# Patient Record
Sex: Female | Born: 1975
Health system: Southern US, Community
[De-identification: ages and names within clinical notes are randomized; demographics above are authoritative.]

## PROBLEM LIST (undated history)

## (undated) DIAGNOSIS — E079 Disorder of thyroid, unspecified: Secondary | ICD-10-CM

## (undated) DIAGNOSIS — T7840XA Allergy, unspecified, initial encounter: Secondary | ICD-10-CM

## (undated) DIAGNOSIS — J45909 Unspecified asthma, uncomplicated: Secondary | ICD-10-CM

## (undated) HISTORY — DX: Allergy, unspecified, initial encounter: T78.40XA

## (undated) HISTORY — DX: Disorder of thyroid, unspecified: E07.9

## (undated) HISTORY — DX: Unspecified asthma, uncomplicated: J45.909

---

## 2006-11-19 ENCOUNTER — Inpatient Hospital Stay (HOSPITAL_COMMUNITY): Admission: AD | Admit: 2006-11-19 | Discharge: 2006-11-23 | Payer: Self-pay | Admitting: Obstetrics and Gynecology

## 2008-01-03 ENCOUNTER — Encounter: Payer: Self-pay | Admitting: Internal Medicine

## 2008-01-03 ENCOUNTER — Ambulatory Visit: Payer: Self-pay | Admitting: Internal Medicine

## 2008-01-03 DIAGNOSIS — J309 Allergic rhinitis, unspecified: Secondary | ICD-10-CM | POA: Insufficient documentation

## 2008-01-03 DIAGNOSIS — F411 Generalized anxiety disorder: Secondary | ICD-10-CM | POA: Insufficient documentation

## 2008-01-03 DIAGNOSIS — D509 Iron deficiency anemia, unspecified: Secondary | ICD-10-CM | POA: Insufficient documentation

## 2008-01-05 ENCOUNTER — Emergency Department (HOSPITAL_COMMUNITY): Admission: EM | Admit: 2008-01-05 | Discharge: 2008-01-05 | Payer: Self-pay | Admitting: Emergency Medicine

## 2008-04-10 ENCOUNTER — Ambulatory Visit: Payer: Self-pay | Admitting: Internal Medicine

## 2008-04-10 DIAGNOSIS — J45909 Unspecified asthma, uncomplicated: Secondary | ICD-10-CM | POA: Insufficient documentation

## 2009-01-26 ENCOUNTER — Inpatient Hospital Stay (HOSPITAL_COMMUNITY): Admission: AD | Admit: 2009-01-26 | Discharge: 2009-01-26 | Payer: Self-pay | Admitting: Obstetrics & Gynecology

## 2009-01-28 ENCOUNTER — Ambulatory Visit (HOSPITAL_COMMUNITY): Admission: RE | Admit: 2009-01-28 | Discharge: 2009-01-28 | Payer: Self-pay | Admitting: Obstetrics and Gynecology

## 2009-02-03 ENCOUNTER — Inpatient Hospital Stay (HOSPITAL_COMMUNITY): Admission: AD | Admit: 2009-02-03 | Discharge: 2009-02-03 | Payer: Self-pay | Admitting: Obstetrics and Gynecology

## 2009-02-08 ENCOUNTER — Ambulatory Visit (HOSPITAL_COMMUNITY): Admission: RE | Admit: 2009-02-08 | Discharge: 2009-02-08 | Payer: Self-pay | Admitting: Obstetrics and Gynecology

## 2009-06-13 ENCOUNTER — Inpatient Hospital Stay (HOSPITAL_COMMUNITY): Admission: RE | Admit: 2009-06-13 | Discharge: 2009-06-15 | Payer: Self-pay | Admitting: Obstetrics and Gynecology

## 2010-02-02 ENCOUNTER — Encounter: Payer: Self-pay | Admitting: Obstetrics and Gynecology

## 2010-02-10 ENCOUNTER — Ambulatory Visit
Admission: RE | Admit: 2010-02-10 | Discharge: 2010-02-10 | Payer: Self-pay | Source: Home / Self Care | Attending: Internal Medicine | Admitting: Internal Medicine

## 2010-02-10 DIAGNOSIS — K649 Unspecified hemorrhoids: Secondary | ICD-10-CM | POA: Insufficient documentation

## 2010-02-19 NOTE — Assessment & Plan Note (Signed)
Summary: last ov 2010/?hemorroid/pain rectal/jones-no openings/cd   Vital Signs:  Patient profile:   35 year old female Height:      64 inches Weight:      123 pounds BMI:     21.19 Temp:     98.0 degrees F oral Pulse rate:   76 / minute Pulse rhythm:   regular Resp:     16 per minute BP sitting:   100 / 62  (left arm) Cuff size:   regular  Vitals Entered By: Lanier Prude, CMA(AAMA) (February 10, 2010 4:20 PM) CC: painful bowel movements and lump on rectum Comments pt is not taking Proctosol, birth control, ProAir, Fluticasone or Prednisone   CC:  painful bowel movements and lump on rectum.  History of Present Illness: C/o constipation x years (after 1st baby). This am she had discofort and felt something near her anus. It was uncomfortable. No bleeding. Lately, her BMs were nl.  Current Medications (verified): 1)  Cetirizine Hcl 10 Mg Tabs (Cetirizine Hcl) .Marland Kitchen.. 1 By Mouth Daily 2)  Proctosol Hc 2.5 % Crea (Hydrocortisone) .... Use Small Amount As Needed 3)  Birth Control Pills (?) 4)  Proair Hfa 108 (90 Base) Mcg/act  Aers (Albuterol Sulfate) .... 2 Inh Q4h As Needed Shortness of Breath 5)  Fluticasone Propionate 50 Mcg/act  Susp (Fluticasone Propionate) .... 2 Sprays Each Nostril Once Daily 6)  Prednisone 10 Mg  Tabs (Prednisone) .... Take 40mg  Qd For 3 Days, Then 20 Mg Qd For 3 Days, Then 10mg  Qd For 6 Days, Then Stop. Take Pc. 7)  Levothroid 75 Mcg Tabs (Levothyroxine Sodium) .Marland Kitchen.. 1 By Mouth Once Daily 8)  Probiotic  Caps (Probiotic Product) .Marland Kitchen.. 1-2 By Mouth Once Daily  Allergies (verified): 1)  ! 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   Skin:  Intact without suspicious lesions or rashes   Impression & Recommendations:  Problem # 1:  HEMORRHOIDS (ICD-455.6) Assessment New  Procedure: Anoscopy Indication: Rectal bleeding Risks and benefits were explained. The pt. was placed in the L decubitus position. Digital rectal exam revealed soft 6 o'clock mass <1 cm. Anoscope was introduced w/o difficulties. Upon withdrawl, a carefull look at the mucosa was obtained. At  6 o'clock a   12     mm hemorrhoid was present with a 5 mm clot without active bleeding about 0.5 cm into tha anal canal. Impression: Intternal hemorrhoid, partially thrombosed. Disposition: see A&P.  Tolerated well. Complications: none.  GI consult if issues  Orders: Anoscopy (84696)  Complete Medication List: 1)  Cetirizine Hcl 10 Mg Tabs (Cetirizine hcl) .Marland Kitchen.. 1 by mouth daily 2)  Proctosol Hc 2.5 % Crea (Hydrocortisone) .... Use small amount as needed 3)  Birth Control Pills (?)  4)  Proair Hfa 108 (90 Base) Mcg/act Aers (Albuterol sulfate) .... 2 inh q4h as needed shortness of breath 5)  Fluticasone Propionate 50 Mcg/act Susp (Fluticasone propionate) .... 2 sprays each nostril once daily 6)  Levothroid 75 Mcg  Tabs (Levothyroxine sodium) .Marland Kitchen.. 1 by mouth once daily 7)  Probiotic Caps (Probiotic product) .Marland Kitchen.. 1-2 by mouth once daily 8)  Anusol-hc 25 Mg Supp (Hydrocortisone acetate) .Marland Kitchen.. 1 pr two times a day for hemorrhoids 9)  Anusol-hc 2.5 % Crea (Hydrocortisone) .... Use two times a day for hemorrhoids 10)  Advair Diskus 100-50 Mcg/dose Misc (Fluticasone-salmeterol) .... Take 1 inh  twice a day  Patient Instructions: 1)  Senokot-S 1 or 2 a  day 2)  Aspirin 325 mg 2 by mouth two times a day pc x 1-2 wks 3)  Call if you are not better in a reasonable amount of time or if worse.  4)  F/u with Dr Jonny Ruiz Prescriptions: ADVAIR DISKUS 100-50 MCG/DOSE MISC (FLUTICASONE-SALMETEROL) Take 1 inh  twice a day  #1 x 3   Entered and Authorized by:   Tresa Garter MD   Signed by:   Tresa Garter MD on 02/10/2010   Method used:   Print then Give to Patient   RxID:   [id number] ANUSOL-HC 2.5 % CREA (HYDROCORTISONE) use two times a day for hemorrhoids  #45 g x 3   Entered and Authorized by:   Tresa Garter MD   Signed by:   Tresa Garter MD on 02/10/2010   Method used:   Print then Give to Patient   RxID:   [id number] ANUSOL-HC 25 MG SUPP (HYDROCORTISONE ACETATE) 1 pr two times a day for hemorrhoids  #20 x 3   Entered and Authorized by:   Tresa Garter MD   Signed by:   Tresa Garter MD on 02/10/2010   Method used:   Print then Give to Patient   RxID:   [id number]    Orders Added: 1)  Est. Patient Level III [01027] 2)  Anoscopy [25366]

## 2010-03-30 LAB — URINALYSIS, ROUTINE W REFLEX MICROSCOPIC
Bilirubin Urine: NEGATIVE
Glucose, UA: NEGATIVE mg/dL
Hgb urine dipstick: NEGATIVE
Ketones, ur: NEGATIVE mg/dL
Ketones, ur: NEGATIVE mg/dL
Specific Gravity, Urine: <1.005 — ABNORMAL LOW (ref 1.005–1.030)
Specific Gravity, Urine: 1.01 (ref 1.005–1.030)
Urobilinogen, UA: 0.2 mg/dL (ref 0.0–1.0)
pH: 7 (ref 5.0–8.0)
pH: 7 (ref 5.0–8.0)

## 2010-03-30 LAB — URINE MICROSCOPIC-ADD ON

## 2010-03-31 LAB — CBC
HCT: 31.3 % — ABNORMAL LOW (ref 36.0–46.0)
Hemoglobin: 10.8 g/dL — ABNORMAL LOW (ref 12.0–15.0)
MCHC: 34.3 g/dL (ref 30.0–36.0)
MCHC: 34.4 g/dL (ref 30.0–36.0)
MCHC: 34.5 g/dL (ref 30.0–36.0)
MCV: 85.5 fL (ref 78.0–100.0)
MCV: 86.2 fL (ref 78.0–100.0)
Platelets: 156 10*3/uL (ref 150–400)
RBC: 3.64 MIL/uL — ABNORMAL LOW (ref 3.87–5.11)
RBC: 4.56 MIL/uL (ref 3.87–5.11)
RDW: 14.4 % (ref 11.5–15.5)
WBC: 11.1 10*3/uL — ABNORMAL HIGH (ref 4.0–10.5)
WBC: 12.8 10*3/uL — ABNORMAL HIGH (ref 4.0–10.5)

## 2010-04-22 ENCOUNTER — Other Ambulatory Visit: Payer: Self-pay | Admitting: Internal Medicine

## 2010-05-27 NOTE — Op Note (Signed)
Maria Crawford, Maria Crawford                 ACCOUNT NO.:  [id number]   MEDICAL RECORD NO.:  [id number]          PATIENT TYPE:  INP   LOCATION:  9107                          FACILITY:  [facility]   PHYSICIAN:  Ilda Mori, M.D.   DATE OF BIRTH:  1975-01-25   DATE OF PROCEDURE:  11/20/2006  DATE OF DISCHARGE:                               OPERATIVE REPORT   PREOPERATIVE DIAGNOSIS:  Failure to progress.   POSTOPERATIVE DIAGNOSIS:  Failure to progress.   PROCEDURE:  Primary low transverse cesarean section.   SURGEON:  Dr. Ilda Mori.   ANESTHESIA:  Epidural.   ESTIMATED BLOOD LOSS:  800 mL.   FINDINGS:  Female infant 8 pounds 5 ounces, Apgar scores 6 and 9, normal-  appearing uterus, tubes and ovaries, slightly meconium-stained amniotic  fluid.   INDICATIONS:  This is a 35 year old primigravid female who was admitted  on the evening of November 7 with rupture of membranes.  The patient was  started on IV Pitocin and given Ancef prophylaxis for positive group B  strep screen with a low risk penicillin allergy.  The patient progressed  slowly through the night on the morning of November 8.  She had a large  gush of fluid which appeared to be rupture of her forewaters or this may  have been the true rupture of membranes.  The patient was approximately  4-5 cm at that time with the vertex in -1, -2 station.  During the  course of the day, the patient's contractions were not consisted. An  IUPC was placed and it took several hours for adequate labor to be  established based upon greater than 200 [place] units.  The patient  was 5-6 cm at 2:30 p.m. and this was felt to be representing a change  with the vertex being well applied to the cervix and beginning to  descend into the birth canal.  After two hours of adequate labor, a  recheck revealed a cervix was only slightly more dilated although the  head appeared to have descended even further.  Based upon a slow  progress and the wishes of  the patient, the decision was made to proceed  with cesarean section.   PROCEDURE:  The patient is taken to the operating room and the epidural  anesthesia that had been used for labor was injected for surgical  anesthesia.  The abdomen was prepped and draped in sterile fashion.  The  bladder had previously been catheterized.  A low transverse abdominal  incision was made and carried down to the fascia which was entered  transversely.  The fascia was then separated from the underlying rectus  muscle and the rectus midline was identified and the peritoneal cavity  was entered by sharp and blunt dissection.  This incision was then  extended bluntly.  A self-retaining retractor was then placed, the lower  segment was identified.  Incision was made and carefully down to the  amnion which was then opened with mild slightly stained meconium fluid  noted.  The infant was then delivered without difficulty.  The placenta  was sent  for cord blood collection at that time.  The uterus was bluntly  curettaged.  The lower segment was then closed with single layer of  running interlocking 0 Vicryl suture.  The tubes and ovaries were  identified and appeared to be normal.  The self-retaining tract was  removed and  peritoneum was closed with running 3-0 Vicryl suture and the rectus  muscle was reapposed in the midline.  The fascia was closed with running  0 Vicryl suture.  The skin was closed with staples.  The patient  tolerated the procedure well and left the operative room in good  condition.      Ilda Mori, M.D.  Electronically Signed     RK/MEDQ  D:  11/20/2006  T:  11/22/2006  Job:  [id number]

## 2010-05-30 NOTE — Discharge Summary (Signed)
NAMETRANISE, FORREST                 ACCOUNT NO.:  [id number]   MEDICAL RECORD NO.:  [id number]           PATIENT TYPE:   LOCATION:                                 FACILITY:   PHYSICIAN:  Carrington Clamp, M.D.      DATE OF BIRTH:   DATE OF ADMISSION:  11/19/2006  DATE OF DISCHARGE:  11/23/2006                               DISCHARGE SUMMARY   FINAL DIAGNOSIS:  Intrauterine pregnancy at 40-2/[redacted] weeks gestation,  active labor, positive group B strep, failure to progress.   PROCEDURE:  Primary low transverse cesarean section.  Surgeon Dr.  Ilda Mori.  Complications none.   This 35 year old G1 P0 was admitted on the evening of November 7 with  positive rupture of membranes in early labor.  The patient did have  positive group B strep status known and was started on Ancef for  prophylaxis and had  history of a low-risk Penicillin allergy.   The patient's antepartum course up to this point had been uncomplicated  except for that positive group B strep culture  found to the end of her  pregnancy.  The patient progressed through the night until the morning  of the 8th, where she was about 4-5 cm at this time. Throughout the  course of the day, patient's contractions were not consistent.  IUPCs  were placed.  By 2:30 that afternoon, the patient still had no change in  her cervix and after 2 hours adequate labor, rechecked later the patient  still had no change.  She was diagnosed with failure to progress and  with a discussion with the patient a decision was made to proceed with a  cesarean section.  The patient was taken to the operating room on  November 20, 2006 by Dr. Ilda Mori where a primary low transverse  cesarean section was performed with the delivery of a 8 pounds 5 ounces  female infant with Apgars of six and nine.  Delivery went without  complications.  The patient's postoperative course was benign without  any significant fevers.  The patient was felt ready for discharge  on  postoperative day #3.  She was sent home on a regular diet, told to  decrease activities, told to continue her vitamins and iron supplement  daily, was given Percocet one to two every 4-6 hours as needed for the  pain, told she could use over-the-counter ibuprofen up to 600 mg every 6  hours as needed for pain, was to follow up in our office in 4 weeks.   Instructions and precautions were reviewed with the patient.   LABS ON DISCHARGE:  She had a hemoglobin of 9.3, white blood cell count  of 10.9, platelets of 177,000.      Leilani Able, P.A.-C.      Carrington Clamp, M.D.  Electronically Signed    MB/MEDQ  D:  12/21/2006  T:  12/21/2006  Job:  [id number]

## 2010-08-21 ENCOUNTER — Ambulatory Visit: Payer: Self-pay | Admitting: Internal Medicine

## 2010-10-17 LAB — URINALYSIS, ROUTINE W REFLEX MICROSCOPIC
Bilirubin Urine: NEGATIVE
Glucose, UA: NEGATIVE mg/dL
Hgb urine dipstick: NEGATIVE
Ketones, ur: 15 mg/dL — AB
Nitrite: NEGATIVE
pH: 6.5 (ref 5.0–8.0)

## 2010-10-17 LAB — POCT I-STAT, CHEM 8
Calcium, Ion: 1.19 mmol/L (ref 1.12–1.32)
Chloride: 106 mEq/L (ref 96–112)
HCT: 43 % (ref 36.0–46.0)
Hemoglobin: 14.6 g/dL (ref 12.0–15.0)
Potassium: 3.2 mEq/L — ABNORMAL LOW (ref 3.5–5.1)
TCO2: 21 mmol/L (ref 0–100)

## 2010-10-17 LAB — CBC
MCV: 78.6 fL (ref 78.0–100.0)
RBC: 5.26 MIL/uL — ABNORMAL HIGH (ref 3.87–5.11)
RDW: 14.5 % (ref 11.5–15.5)

## 2010-10-17 LAB — DIFFERENTIAL
Basophils Absolute: 0.1 10*3/uL (ref 0.0–0.1)
Basophils Relative: 0 % (ref 0–1)
Monocytes Absolute: 0.9 10*3/uL (ref 0.1–1.0)
Monocytes Relative: 7 % (ref 3–12)
Neutrophils Relative %: 59 % (ref 43–77)

## 2010-10-17 LAB — URINE MICROSCOPIC-ADD ON

## 2010-10-21 LAB — CBC
MCHC: 33.9
MCHC: 35.4
MCV: 82.4
MCV: 83.9
MCV: 84.5
Platelets: 148 — ABNORMAL LOW
Platelets: 177
RBC: 3.25 — ABNORMAL LOW
RBC: 4.3
WBC: 10.9 — ABNORMAL HIGH
WBC: 17.1 — ABNORMAL HIGH

## 2010-10-21 LAB — DIFFERENTIAL
Basophils Absolute: 0
Basophils Relative: 0
Eosinophils Absolute: 0.1 — ABNORMAL LOW
Eosinophils Relative: 1
Monocytes Relative: 7
Neutro Abs: 8 — ABNORMAL HIGH
Neutrophils Relative %: 73

## 2010-12-10 ENCOUNTER — Other Ambulatory Visit: Payer: Self-pay | Admitting: Internal Medicine

## 2010-12-10 DIAGNOSIS — E039 Hypothyroidism, unspecified: Secondary | ICD-10-CM

## 2010-12-18 ENCOUNTER — Other Ambulatory Visit: Payer: Self-pay

## 2010-12-19 ENCOUNTER — Other Ambulatory Visit: Payer: Self-pay

## 2011-03-13 ENCOUNTER — Ambulatory Visit
Admission: RE | Admit: 2011-03-13 | Discharge: 2011-03-13 | Disposition: A | Discharge status: Home / Self Care | Payer: BC Managed Care – PPO | Source: Ambulatory Visit | Attending: Internal Medicine | Admitting: Internal Medicine

## 2011-03-13 DIAGNOSIS — E039 Hypothyroidism, unspecified: Secondary | ICD-10-CM

## 2012-06-11 ENCOUNTER — Ambulatory Visit (INDEPENDENT_AMBULATORY_CARE_PROVIDER_SITE_OTHER): Payer: BC Managed Care – PPO | Admitting: Physician Assistant

## 2012-06-11 VITALS — BP 114/68 | HR 92 | Temp 98.3°F | Resp 16 | Ht 64.0 in | Wt 116.0 lb

## 2012-06-11 DIAGNOSIS — J020 Streptococcal pharyngitis: Secondary | ICD-10-CM

## 2012-06-11 DIAGNOSIS — J029 Acute pharyngitis, unspecified: Secondary | ICD-10-CM

## 2012-06-11 LAB — POCT RAPID STREP A (OFFICE): Rapid Strep A Screen: POSITIVE — AB

## 2012-06-11 MED ORDER — AZITHROMYCIN 250 MG PO TABS: ORAL_TABLET | ORAL | Status: DC

## 2012-06-11 NOTE — Progress Notes (Signed)
  Subjective:    Patient ID: Olin Hauser, female    DOB: Jun 26, 1975, 37 y.o.   MRN: [id number]  HPI This 37 y.o. female presents for evaluation of sore throat that began 2 days ago.  Her 35 year old had strep throat last week.  She was delayed in getting an allergy shot (it had been 5 weeks since her previous dose), and she developed increased URI symptoms, sore throat, and generalized muscle aches after the dose.  No fever/chills.  No abdominal pain, N/V.  No rash.    Past medical history, surgical history, family history, social history and problem list reviewed.  Review of Systems As above    Objective:   Physical Exam Blood pressure 114/68, pulse 92, temperature 98.3 F (36.8 C), temperature source Oral, resp. rate 16, height 5\' 4"  (1.626 m), weight 116 lb (52.617 kg), last menstrual period 05/19/2012, SpO2 95.00%. Body mass index is 19.9 kg/(m^2). Well-developed, well nourished WF who is awake, alert and oriented, in NAD. HEENT: [place]/AT, PERRL, EOMI.  Sclera and conjunctiva are clear.  EAC are patent, TMs are normal in appearance. Nasal mucosa is pink and moist. OP is clear. Neck: supple, non-tender, no lymphadenopathy, thyromegaly. Heart: RRR, no murmur Lungs: normal effort, CTA Extremities: no cyanosis, clubbing or edema. Skin: warm and dry without rash. Psychologic: good mood and appropriate affect, normal speech and behavior.   Results for orders placed in visit on 06/11/12  POCT RAPID STREP A (OFFICE)      Result Value Range   Rapid Strep A Screen Positive (*) Negative       Assessment & Plan:  Acute pharyngitis - Plan: POCT rapid strep A  Strep pharyngitis - Plan: azithromycin (ZITHROMAX) 250 MG tablet (PCN allergic).    This diagnosis was surprising, as we both thought it very likely her symptoms were reactive to her allergy injection.  Supportive care.  Anticipatory Guidance.    Fernande Bras, PA-C Physician Assistant-Certified [facility]

## 2012-06-11 NOTE — Patient Instructions (Signed)
Get plenty of rest and drink at least 64 ounces of water daily. 

## 2013-10-31 IMAGING — US US SOFT TISSUE HEAD/NECK
1 series · 14 of 25 positions shown · non-contrast
Comparison: None

CLINICAL DATA: Hypothyroidism, thyroiditis following pregnancy in
2577

THYROID ULTRASOUND
TECHNIQUE: Ultrasound examination of the thyroid gland and adjacent
soft tissues was performed.

[Series 1: us soft tissue head/neck · 0.08mm/px · 14 of 33 slices shown]
[im 1/33]
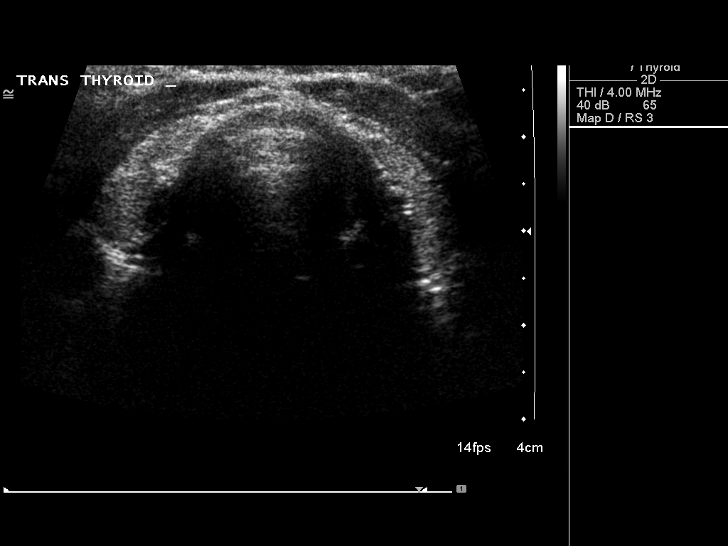
[im 3/33]
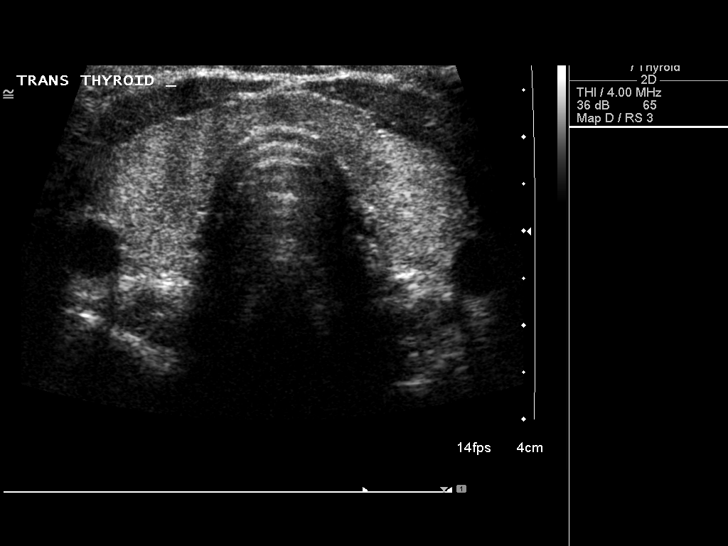
[im 6/33]
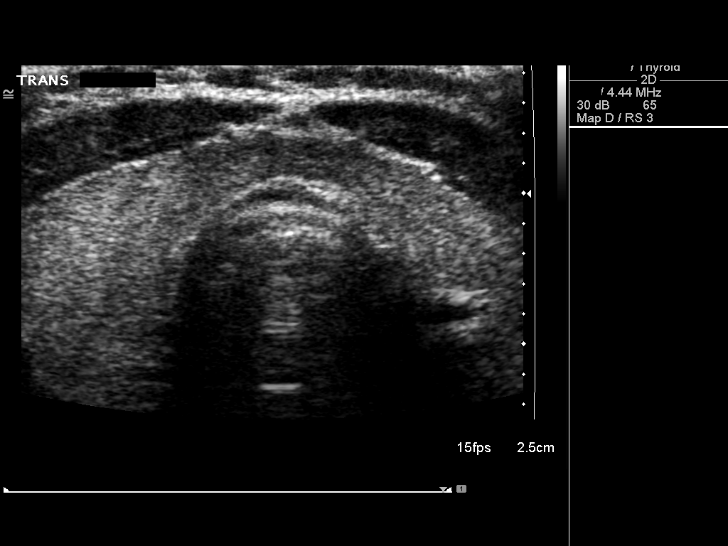
[im 9/33]
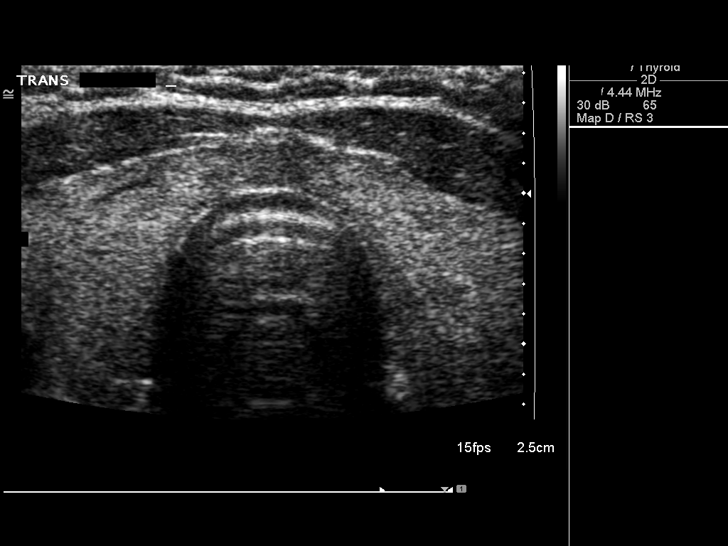
[im 11/33]
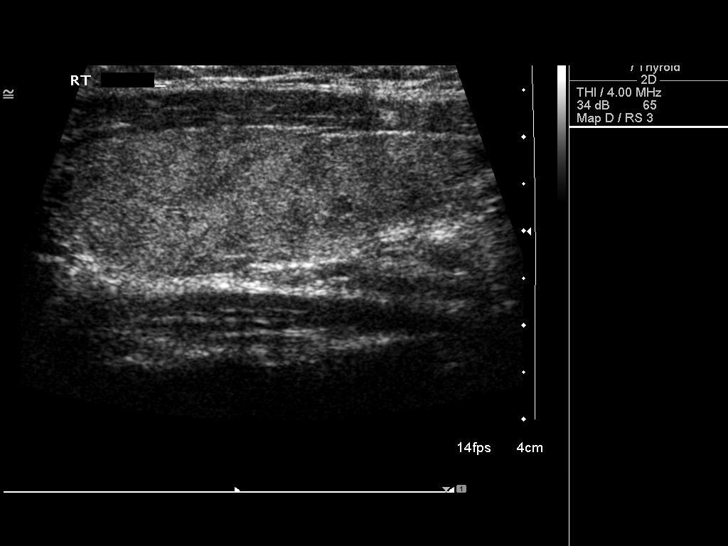
[im 13/33]
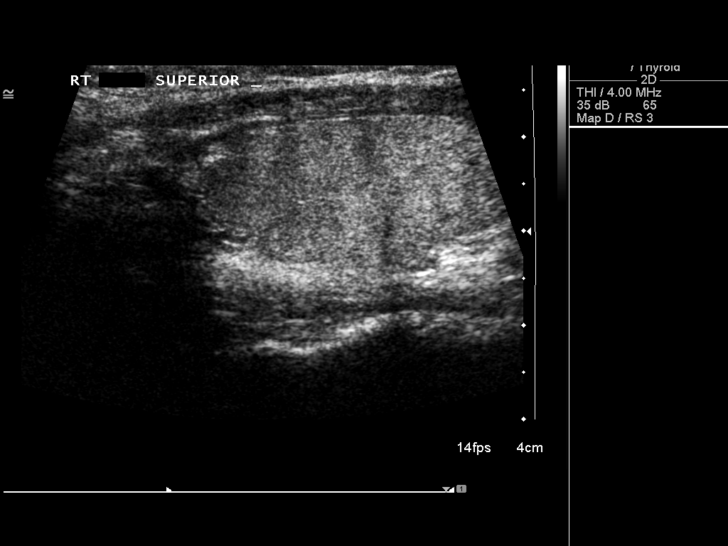
[im 15/33]
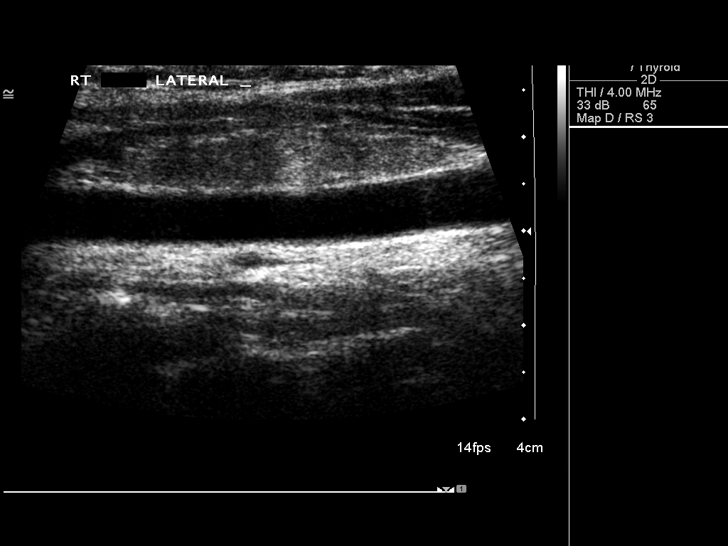
[im 18/33]
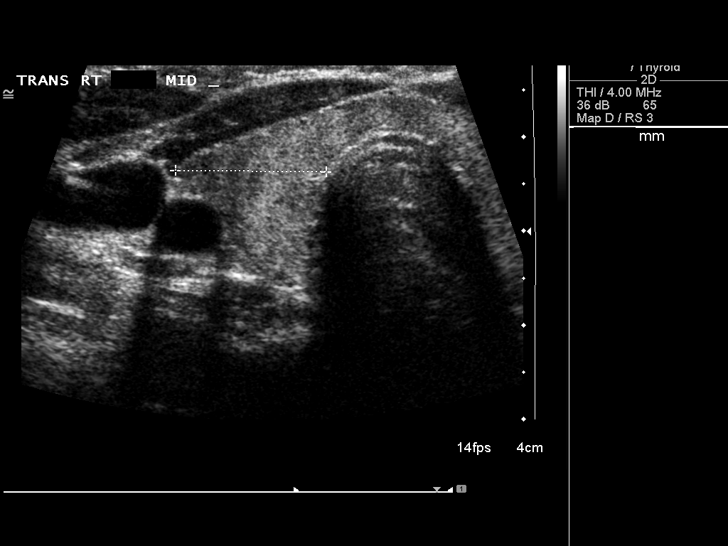
[im 21/33]
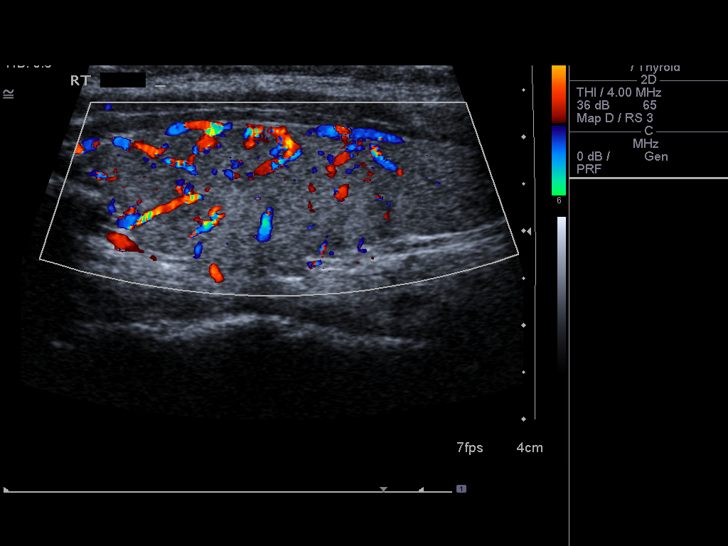
[im 22/33]
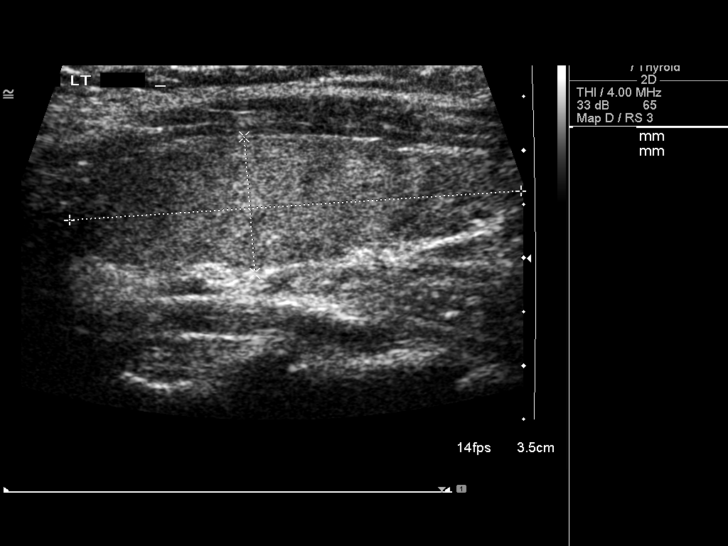
[im 25/33]
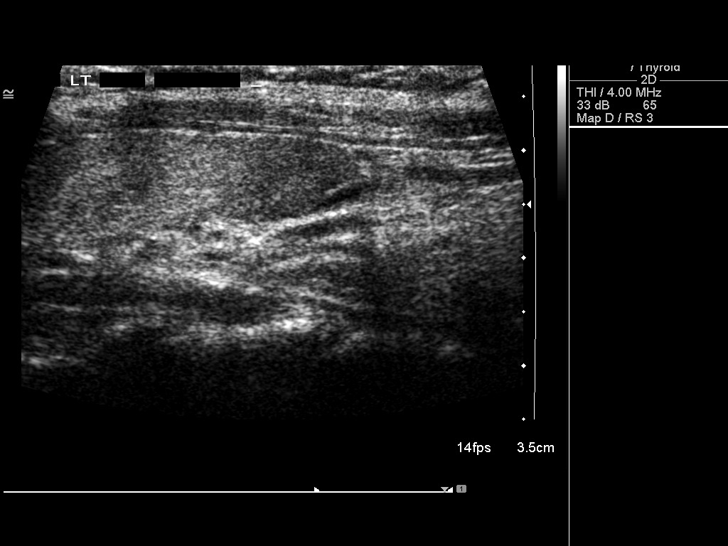
[im 27/33]
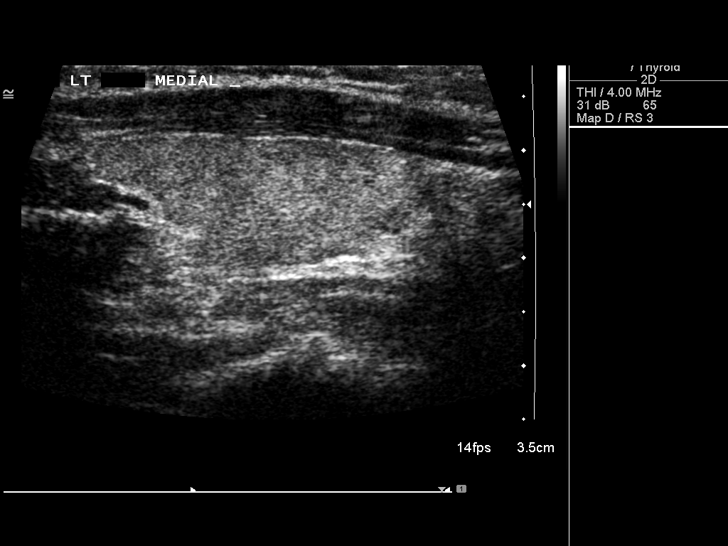
[im 30/33]
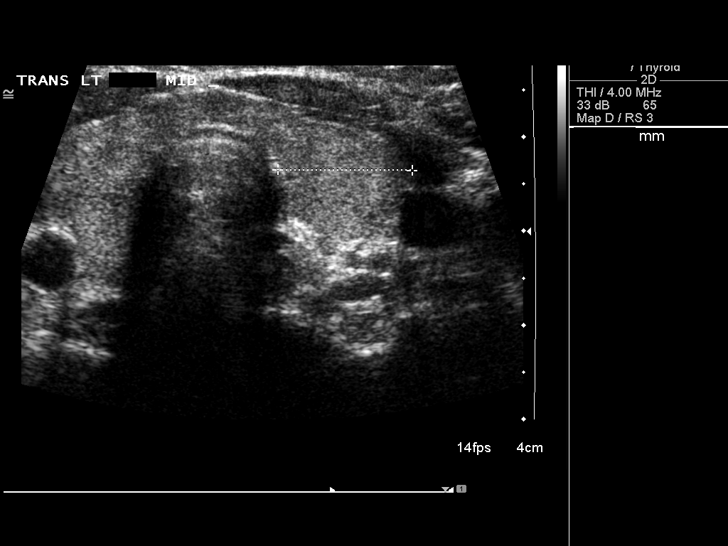
[im 33/33]
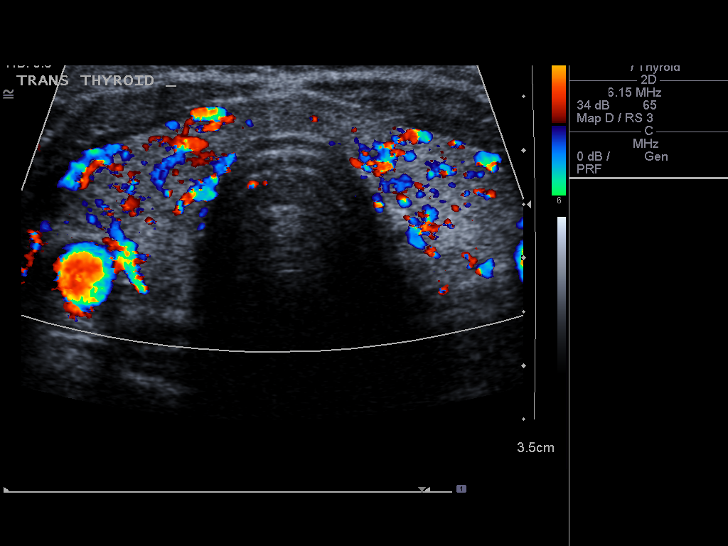

[14 of 25 positions shown; findings below may reference images not displayed]

FINDINGS: Right thyroid lobe:  4.6 x 1.6 x 1.6 cm.
Left thyroid lobe:  4.2 x 1.3 x 1.4 cm.
Isthmus:  4 mm thick

Focal nodules:  Mild diffuse inhomogeneity of thyroid parenchyma
throughout both lobes.  No focal mass or nodule identified.  No
thyroid calcifications or cysts.

Lymphadenopathy:  None identified
IMPRESSION: No focal thyroid abnormalities identified.

## 2014-09-19 ENCOUNTER — Ambulatory Visit (INDEPENDENT_AMBULATORY_CARE_PROVIDER_SITE_OTHER): Payer: BC Managed Care – PPO | Admitting: Urgent Care

## 2014-09-19 VITALS — BP 108/68 | HR 99 | Temp 98.5°F | Resp 17 | Ht 63.5 in | Wt 115.0 lb

## 2014-09-19 DIAGNOSIS — R319 Hematuria, unspecified: Secondary | ICD-10-CM

## 2014-09-19 DIAGNOSIS — R300 Dysuria: Secondary | ICD-10-CM | POA: Diagnosis not present

## 2014-09-19 DIAGNOSIS — N3090 Cystitis, unspecified without hematuria: Secondary | ICD-10-CM | POA: Diagnosis not present

## 2014-09-19 LAB — POCT UA - MICROSCOPIC ONLY
CASTS, UR, LPF, POC: NEGATIVE
CRYSTALS, UR, HPF, POC: NEGATIVE
Mucus, UA: NEGATIVE
Yeast, UA: NEGATIVE

## 2014-09-19 LAB — POCT URINALYSIS DIPSTICK
Bilirubin, UA: NEGATIVE
Glucose, UA: NEGATIVE
KETONES UA: NEGATIVE
Nitrite, UA: NEGATIVE
SPEC GRAV UA: 1.015
UROBILINOGEN UA: 0.2
pH, UA: 8.5

## 2014-09-19 LAB — URINE CULTURE
COLONY COUNT: NO GROWTH
Organism ID, Bacteria: NO GROWTH

## 2014-09-19 MED ORDER — CIPROFLOXACIN HCL 500 MG PO TABS: 500.0000 mg | ORAL_TABLET | Freq: Two times a day (BID) | ORAL | Status: DC

## 2014-09-19 NOTE — Patient Instructions (Signed)

## 2014-09-19 NOTE — Progress Notes (Signed)
    MRN: [id number] DOB: 09-10-1975  Subjective:   Maria Crawford is a 39 y.o. female presenting for chief complaint of Dysuria  Reports 2 day history of dysuria, hematuria and urinary frequency. Has tried apple cider vinegar without any relief. Denies fever, flank pain, abdominal pain, pelvic pain, cloudy malordorous urine, genital rash, genital irritation and vaginal discharge, nausea, vomiting and abdominal pain. Has a history of UTIs, this feels similar to previous UTIs. Denies any other aggravating or relieving factors, no other questions or concerns.  Maria Crawford is taking cetirizine and multivitamins. She is allergic to penicillins.  Maria Crawford  has a past medical history of Allergy; Asthma; and Thyroid disease. Also  has no past surgical history on file.  ROS As in subjective.  Objective:   Vitals: BP 108/68 mmHg  Pulse 99  Temp(Src) 98.5 F (36.9 C) (Oral)  Resp 17  Ht 5' 3.5" (1.613 m)  Wt 115 lb (52.164 kg)  BMI 20.05 kg/m2  SpO2 98%  LMP 09/05/2014  Physical Exam  Constitutional: She is oriented to person, place, and time. She appears well-developed and well-nourished.  Cardiovascular: Normal rate, regular rhythm and intact distal pulses.  Exam reveals no gallop and no friction rub.   No murmur heard. Pulmonary/Chest: No respiratory distress. She has no wheezes. She has no rales.  Abdominal: Soft. Bowel sounds are normal. She exhibits no distension and no mass. There is no tenderness.  No CVA tenderness.  Musculoskeletal: She exhibits no edema.  Neurological: She is alert and oriented to person, place, and time.  Skin: Skin is warm and dry. No rash noted. No erythema. No pallor.    Results for orders placed or performed in visit on 09/19/14 (from the past 24 hour(s))  POCT UA - Microscopic Only     Status: None   Collection Time: 09/19/14  8:54 AM  Result Value Ref Range   WBC, Ur, HPF, POC 10-12    RBC, urine, microscopic 15-20    Bacteria, U Microscopic moderate    Mucus,  UA neg    Epithelial cells, urine per micros 4-6    Crystals, Ur, HPF, POC neg    Casts, Ur, LPF, POC neg    Yeast, UA neg   POCT urinalysis dipstick     Status: Abnormal   Collection Time: 09/19/14  8:54 AM  Result Value Ref Range   Color, UA yellow    Clarity, UA clear    Glucose, UA neg    Bilirubin, UA neg    Ketones, UA neg    Spec Grav, UA 1.015    Blood, UA large    pH, UA 8.5    Protein, UA trace    Urobilinogen, UA 0.2    Nitrite, UA neg    Leukocytes, UA moderate (2+) (A) Negative   Assessment and Plan :   1. Cystitis 2. Dysuria 3. Hematuria - Urine culture pending. Will start cipro x7 days, advised aggressive hydration. RTC if urine cultures have not yet resulted and symptoms worsen including fever, nausea, vomiting, flank pain, abdominal pain.   Wallis Bamberg, PA-C [facility] [phone] 09/19/2014 8:37 AM

## 2014-09-20 ENCOUNTER — Telehealth: Payer: Self-pay | Admitting: Urgent Care

## 2014-09-20 NOTE — Telephone Encounter (Signed)
Urine culture was negative. Recommended patient continue antibiotic course of Cipro, advised that if she has no improvement in the next 3-4 days that she return to clinic for further testing including pelvic exam, STI testing. Request that patient give [place] a call back if she had questions or concerns.

## 2014-09-25 ENCOUNTER — Other Ambulatory Visit: Payer: Self-pay | Admitting: Obstetrics and Gynecology

## 2015-04-17 DIAGNOSIS — F4323 Adjustment disorder with mixed anxiety and depressed mood: Secondary | ICD-10-CM | POA: Diagnosis not present

## 2015-04-18 DIAGNOSIS — R300 Dysuria: Secondary | ICD-10-CM | POA: Diagnosis not present

## 2015-05-06 DIAGNOSIS — F4323 Adjustment disorder with mixed anxiety and depressed mood: Secondary | ICD-10-CM | POA: Diagnosis not present

## 2015-05-08 DIAGNOSIS — Z114 Encounter for screening for human immunodeficiency virus [HIV]: Secondary | ICD-10-CM | POA: Diagnosis not present

## 2015-05-08 DIAGNOSIS — Z6820 Body mass index (BMI) 20.0-20.9, adult: Secondary | ICD-10-CM | POA: Diagnosis not present

## 2015-05-08 DIAGNOSIS — Z01419 Encounter for gynecological examination (general) (routine) without abnormal findings: Secondary | ICD-10-CM | POA: Diagnosis not present

## 2015-05-08 DIAGNOSIS — Z113 Encounter for screening for infections with a predominantly sexual mode of transmission: Secondary | ICD-10-CM | POA: Diagnosis not present

## 2015-05-08 DIAGNOSIS — Z0000 Encounter for general adult medical examination without abnormal findings: Secondary | ICD-10-CM | POA: Diagnosis not present

## 2015-05-08 DIAGNOSIS — Z1159 Encounter for screening for other viral diseases: Secondary | ICD-10-CM | POA: Diagnosis not present

## 2015-06-12 DIAGNOSIS — F4323 Adjustment disorder with mixed anxiety and depressed mood: Secondary | ICD-10-CM | POA: Diagnosis not present

## 2015-07-17 DIAGNOSIS — F4323 Adjustment disorder with mixed anxiety and depressed mood: Secondary | ICD-10-CM | POA: Diagnosis not present

## 2015-08-21 DIAGNOSIS — F4323 Adjustment disorder with mixed anxiety and depressed mood: Secondary | ICD-10-CM | POA: Diagnosis not present

## 2015-09-25 DIAGNOSIS — F4323 Adjustment disorder with mixed anxiety and depressed mood: Secondary | ICD-10-CM | POA: Diagnosis not present

## 2015-10-15 MED FILL — NATURE-THROID 48.75 MG TAB: 48.75 | 30 days supply | Qty: 60 | Fill #0

## 2015-10-23 DIAGNOSIS — F4323 Adjustment disorder with mixed anxiety and depressed mood: Secondary | ICD-10-CM | POA: Diagnosis not present

## 2015-11-04 DIAGNOSIS — F4322 Adjustment disorder with anxiety: Secondary | ICD-10-CM | POA: Diagnosis not present

## 2015-11-05 DIAGNOSIS — F4323 Adjustment disorder with mixed anxiety and depressed mood: Secondary | ICD-10-CM | POA: Diagnosis not present

## 2015-11-14 MED FILL — NATURE-THROID 48.75 MG TAB: 48.75 | 21 days supply | Qty: 42 | Fill #1

## 2016-01-01 DIAGNOSIS — F4323 Adjustment disorder with mixed anxiety and depressed mood: Secondary | ICD-10-CM | POA: Diagnosis not present

## 2016-02-12 MED FILL — PROAIR HFA 90 MCG INHALER: 108 (90 BAS | 25 days supply | Qty: 9 | Fill #0

## 2016-02-13 ENCOUNTER — Encounter: Payer: Self-pay | Admitting: Internal Medicine

## 2016-02-13 ENCOUNTER — Ambulatory Visit (INDEPENDENT_AMBULATORY_CARE_PROVIDER_SITE_OTHER): Payer: BLUE CROSS/BLUE SHIELD | Admitting: Internal Medicine

## 2016-02-13 VITALS — BP 103/60 | HR 100 | Temp 97.7°F | Resp 12

## 2016-02-13 DIAGNOSIS — B349 Viral infection, unspecified: Secondary | ICD-10-CM

## 2016-02-13 DIAGNOSIS — E559 Vitamin D deficiency, unspecified: Secondary | ICD-10-CM | POA: Insufficient documentation

## 2016-02-13 DIAGNOSIS — E039 Hypothyroidism, unspecified: Secondary | ICD-10-CM | POA: Insufficient documentation

## 2016-02-13 DIAGNOSIS — J40 Bronchitis, not specified as acute or chronic: Secondary | ICD-10-CM | POA: Diagnosis not present

## 2016-02-13 LAB — POC INFLUENZA A&B (BINAX/QUICKVUE)
INFLUENZA A, POC: NEGATIVE
Influenza B, POC: NEGATIVE

## 2016-02-13 MED ORDER — PREDNISONE 20 MG PO TABS
ORAL_TABLET | ORAL | 0 refills | Status: DC
Start: 2016-02-13 — End: 2016-07-22

## 2016-02-13 MED ORDER — FLUTICASONE-UMECLIDIN-VILANT 100-62.5-25 MCG/INH IN AEPB: 1.0000 | INHALATION_SPRAY | Freq: Every day | RESPIRATORY_TRACT | 0 refills | Status: DC

## 2016-02-13 MED ORDER — AZITHROMYCIN 250 MG PO TABS: ORAL_TABLET | ORAL | 1 refills | Status: DC

## 2016-02-13 MED FILL — predniSONE 20 MG TABS: 20 | 11 days supply | Qty: 20 | Fill #0

## 2016-02-13 NOTE — Progress Notes (Signed)
[facility]   Lucky CowboyWilliam Mishti Swanton, M.D.    Dyanne CarrelAmanda R. Steffanie Dunnollier, P.A.-C      Terri Piedraourtney Forcucci, P.A.-C  [facility]                [address]                [place], [place] [id number] Telephone [phone] Telefax [phone]  Subjective:    Patient ID: Maria Crawford, female    DOB: 06/01/1975, 41 y.o.   MRN: [id number]  HPI  This very nice 41 yo DWF with Hypothyroidism circa 2011 who is a nurse at [employer] reports her son tested (+) 3 days ago for Flu and she has developed fevers to 100-101 deg F, severe generalized myalgias and cough productive of scant amounts of thick yellow sputum.   Medication Sig  . cetirizine (ZYRTEC) 10 MG  Take 10 mg by mouth daily.  . magnesium citrate  Take 1 Bottle by mouth once.  . Multiple Vitamins-Minerals  Take 1 tablet by mouth daily.   [place] Thyroid  Uncertain of dose  .  Allergy shots once a week.   Allergies  Allergen Reactions  . Penicillins     REACTION: hives   Past Medical History:  Diagnosis Date  . Allergy   . Asthma   . Thyroid disease    No past surgical history on file.  Review of Systems  10 point systems review negative except as above.    Objective:   Physical Exam  BP 103/60   Pulse 100   Temp 97.7 F (36.5 C)   Resp 12   Dry brassy cough. Sl. Hoarse. No Stridor.   HEENT - Eac's patent. TM's Nl. EOM's full. PERRLA. NasoOroPharynx 1(+) with sl exudate. . Neck - supple. Nl Thyroid. Carotids 2+ & No bruits, nodes, JVD Chest - Few scattered inspiratory rales and coarse expiratory  rhonchi, but no wheezes. Cor - Nl HS. RRR w/o sig MGR. PP 1(+). No edema. MS- FROM w/o deformities. Muscle power, tone and bulk Nl. Gait Nl. Neuro -  Nl w/o focal abnormalities. Skin - exposed clear w/o rash, cyanosis, icterus    Assessment & Plan:   1. Tracheobronchitis  - PROAIR HFA 108 (90 Base) MCG/ACT inhaler;   - Sx's Trelegy Ellipta   2. Viral illness  -  predniSONE (DELTASONE) 20 MG tablet; 1 tab 3 x day for 3 days, then 1 tab 2 x day for 3 days, then 1 tab 1 x day for 5 days  Dispense: 20 tablet; Refill: 0  - azithromycin (ZITHROMAX) 250 MG tablet; Take 2 tablets (500 mg) on  Day 1,  followed by 1 tablet (250 mg) once daily on Days 2 through 5.  Dispense: 6 each; Refill: 1  - POC Influenza A&B(BINAX/QUICKVUE) - negative

## 2016-02-17 MED FILL — ARMOUR THYROID 90 MG TABLET: 90 | 30 days supply | Qty: 30 | Fill #0

## 2016-03-02 DIAGNOSIS — E559 Vitamin D deficiency, unspecified: Secondary | ICD-10-CM | POA: Diagnosis not present

## 2016-03-02 DIAGNOSIS — E039 Hypothyroidism, unspecified: Secondary | ICD-10-CM | POA: Diagnosis not present

## 2016-03-02 DIAGNOSIS — E063 Autoimmune thyroiditis: Secondary | ICD-10-CM | POA: Diagnosis not present

## 2016-03-02 DIAGNOSIS — R5383 Other fatigue: Secondary | ICD-10-CM | POA: Diagnosis not present

## 2016-03-04 ENCOUNTER — Ambulatory Visit (INDEPENDENT_AMBULATORY_CARE_PROVIDER_SITE_OTHER): Payer: BLUE CROSS/BLUE SHIELD | Admitting: Psychology

## 2016-03-04 DIAGNOSIS — F411 Generalized anxiety disorder: Secondary | ICD-10-CM | POA: Diagnosis not present

## 2016-03-12 DIAGNOSIS — R5383 Other fatigue: Secondary | ICD-10-CM | POA: Diagnosis not present

## 2016-03-12 DIAGNOSIS — R5381 Other malaise: Secondary | ICD-10-CM | POA: Diagnosis not present

## 2016-03-12 DIAGNOSIS — F439 Reaction to severe stress, unspecified: Secondary | ICD-10-CM | POA: Diagnosis not present

## 2016-03-12 DIAGNOSIS — E063 Autoimmune thyroiditis: Secondary | ICD-10-CM | POA: Diagnosis not present

## 2016-04-08 ENCOUNTER — Ambulatory Visit: Payer: BLUE CROSS/BLUE SHIELD | Admitting: Psychology

## 2016-04-29 ENCOUNTER — Ambulatory Visit (INDEPENDENT_AMBULATORY_CARE_PROVIDER_SITE_OTHER): Payer: BLUE CROSS/BLUE SHIELD | Admitting: Psychology

## 2016-04-29 DIAGNOSIS — F411 Generalized anxiety disorder: Secondary | ICD-10-CM | POA: Diagnosis not present

## 2016-06-17 ENCOUNTER — Ambulatory Visit (INDEPENDENT_AMBULATORY_CARE_PROVIDER_SITE_OTHER): Payer: BLUE CROSS/BLUE SHIELD | Admitting: Psychology

## 2016-06-17 DIAGNOSIS — F411 Generalized anxiety disorder: Secondary | ICD-10-CM

## 2016-07-22 ENCOUNTER — Other Ambulatory Visit: Payer: Self-pay | Admitting: Internal Medicine

## 2016-07-22 DIAGNOSIS — B349 Viral infection, unspecified: Secondary | ICD-10-CM

## 2016-07-22 DIAGNOSIS — J40 Bronchitis, not specified as acute or chronic: Secondary | ICD-10-CM

## 2016-07-22 MED ORDER — PROAIR HFA 108 (90 BASE) MCG/ACT IN AERS: INHALATION_SPRAY | RESPIRATORY_TRACT | 3 refills | Status: DC

## 2016-07-22 MED ORDER — AZITHROMYCIN 250 MG PO TABS
ORAL_TABLET | ORAL | 1 refills | Status: DC
Start: 2016-07-22 — End: 2016-07-29

## 2016-07-22 MED ORDER — PREDNISONE 20 MG PO TABS: ORAL_TABLET | ORAL | 0 refills | Status: DC

## 2016-07-22 MED FILL — PROAIR HFA 90 MCG INHALER: 108 (90 BAS | 16 days supply | Qty: 9 | Fill #0

## 2016-07-29 ENCOUNTER — Ambulatory Visit (INDEPENDENT_AMBULATORY_CARE_PROVIDER_SITE_OTHER): Payer: BLUE CROSS/BLUE SHIELD | Admitting: Internal Medicine

## 2016-07-29 ENCOUNTER — Encounter: Payer: Self-pay | Admitting: Internal Medicine

## 2016-07-29 VITALS — BP 96/64 | HR 64 | Temp 97.3°F | Resp 16 | Ht 63.5 in | Wt 118.0 lb

## 2016-07-29 DIAGNOSIS — F988 Other specified behavioral and emotional disorders with onset usually occurring in childhood and adolescence: Secondary | ICD-10-CM

## 2016-07-29 MED ORDER — BUPROPION HCL ER (XL) 150 MG PO TB24: 150.0000 mg | ORAL_TABLET | ORAL | 3 refills | Status: DC

## 2016-07-29 NOTE — Patient Instructions (Addendum)
Attention Deficit Hyperactivity Disorder  Attention deficit hyperactivity disorder (ADHD) is a condition that can make it hard for a person to pay attention and concentrate or to control his or her behavior. The person may also have a lot of energy. ADHD is a disorder of the brain (neurodevelopmental disorder), and symptoms are typically first seen in early childhood. It is a common reason for behavioral and academic problems in school. There are three main types of ADHD:  Inattentive. With this type, persons have difficulty paying attention.  Hyperactive-impulsive. With this type, people have a lot of energy and have difficulty controlling their behavior.  Combination. This type involves having symptoms of both of the other types.  ADHD is a lifelong condition. If it is not treated, the disorder can affect one's future academic achievement, employment, and relationships.  What are the causes?  The exact cause of this condition is not known.  What are the signs or symptoms?  Symptoms of this condition depend on the type of ADHD. Symptoms are listed here for each type:  Inattentive   Problems with organization.  Difficulty staying focused.  Problems completing assignments at school.  Often making simple mistakes.  Problems sustaining mental effort.  Not listening to instructions.  Losing things often.  Forgetting things often.  Being easily distracted.   Hyperactive-impulsive   Fidgeting often.  Difficulty sitting still in one's seat.  Talking a lot.  Talking out of turn.  Interrupting others.  Difficulty relaxing or doing quiet activities.  High energy levels and constant movement.  Difficulty waiting.  Always "on the go."   Combination   Having symptoms of both of the other types. People with ADHD may feel frustrated with themselves and may find school to be particularly discouraging. They often perform below their abilities in school. As persons  get older, the excess movement can lessen, but the problems with paying attention and staying organized often continue. Most people do not outgrow ADHD, but with good treatment, they can learn to cope with the symptoms.  How is this diagnosed?  This condition is diagnosed based on a one's symptoms and academic history. The person's health care provider will do a complete assessment. As part of the assessment, the health care provider will ask the person questions. The health care provider looks for specific symptoms of ADHD.   Diagnosis will include:  Ruling out other reasons for the behavior.  A diagnosis is made only after all information from multiple people has been considered. How is this treated?  Treatment for this condition may include:   Behavior therapy.    Medicines to decrease impulsivity and hyperactivity and to increase attention. .   Make sure the person gets a full night of sleep and regular daily exercise.    Help manage behavior by following the techniques learned in therapy. These may include: ? Looking for good behavior and rewarding it. ? Making rules for behavior ? Giving clear instructions. ? Responding consistently tochallenging behaviors. ? Setting realistic goals. ? Looking for activities that can lead to success and self-esteem. ? Making time for pleasant activities  ? Giving lots of affection. ?    learn to be organized. Some ways to do this include: ? Keeping daily schedules the same. Have a regular wake-up time and bedtime. Schedule all activities, including time for homework and time for play. Post the schedule.  Mark schedule changes in advance. ? Having a regular place  to store items such as clothing, backpacks, and  school supplies. ? Encouraging  to write down  Assignments. ?  General instructions  Learn as much as you can about ADHD. This will improve your ability to help.

## 2016-07-29 NOTE — Progress Notes (Signed)
 ADULT & ADOLESCENT INTERNAL MEDICINE   Lucky CowboyWilliam Jesyka Slaght, M .D.      Dyanne CarrelAmanda R. Steffanie Dunnollier, P.A.-C [facility] [address]   [place] [phone] Telephone [phone]) [phone] Telefax [phone](336) [phone]  Subjective:    Patient ID: Maria Crawford, female    DOB: 12/14/1975, 41 y.o.   MRN: [id number]  HPI  This very nice 41 yo DWF with hx/o compensated Hypothyroidism and "allergies" presents with a hx/o distractibility, short attention span, difficulty focusing , daydreaming and difficulty staying on task. She scored 75-80% in the often to very often scale of the Adult ADHD self Report Scale (ASRS-v1) symptom checklist  Medication Sig  . cetirizine  10 MG tablet Take 10 mg by mouth daily.  . magnesium citrate SOLN Take 1 Bottle by mouth once.  . Multi-Vit w/Min Take 1 tablet by mouth daily.  Marland Kitchen. PROAIR HFA   inhaler 1 to 2 inhalations 10-15 minutes apart every 4 hrs as needed   . ARMOUR THYROID 90 MG     Allergies  Allergen Reactions  . Penicillins     REACTION: hives   Review of Systems  10 point systems review negative except as above.    Objective:   Physical Exam  BP 96/64   Pulse 64   Temp (!) 97.3 F (36.3 C)   Resp 16   Ht 5' 3.5" (1.613 m)   Wt 118 lb (53.5 kg)   BMI 20.57 kg/m   HEENT - WNL. Neck - supple.  Chest - Clear equal BS. Cor - Nl HS. RRR w/o sig MGR. PP 1(+). No edema. MS- FROM w/o deformities.  Gait Nl. Neuro -  Nl w/o focal abnormalities.    Assessment & Plan:   1. Attention deficit disorder (ADD) without hyperactivity  - buPROPion (WELLBUTRIN XL) 150 MG 24 hr tablet; Take 1 tablet (150 mg total) by mouth every morning.  Dispense: 90 tablet; Refill: 3  - Discussed treatment options and to avoid stimulants for now  - ROV 4-6 weeks

## 2016-08-19 DIAGNOSIS — E039 Hypothyroidism, unspecified: Secondary | ICD-10-CM | POA: Diagnosis not present

## 2016-08-19 DIAGNOSIS — E063 Autoimmune thyroiditis: Secondary | ICD-10-CM | POA: Diagnosis not present

## 2016-08-19 DIAGNOSIS — E559 Vitamin D deficiency, unspecified: Secondary | ICD-10-CM | POA: Diagnosis not present

## 2016-08-19 DIAGNOSIS — R5383 Other fatigue: Secondary | ICD-10-CM | POA: Diagnosis not present

## 2016-09-02 ENCOUNTER — Ambulatory Visit: Payer: Self-pay | Admitting: Internal Medicine

## 2016-09-09 ENCOUNTER — Ambulatory Visit (INDEPENDENT_AMBULATORY_CARE_PROVIDER_SITE_OTHER): Payer: BLUE CROSS/BLUE SHIELD | Admitting: Psychology

## 2016-09-09 DIAGNOSIS — F411 Generalized anxiety disorder: Secondary | ICD-10-CM

## 2016-09-10 ENCOUNTER — Ambulatory Visit (INDEPENDENT_AMBULATORY_CARE_PROVIDER_SITE_OTHER): Payer: BLUE CROSS/BLUE SHIELD | Admitting: Internal Medicine

## 2016-09-10 DIAGNOSIS — F988 Other specified behavioral and emotional disorders with onset usually occurring in childhood and adolescence: Secondary | ICD-10-CM | POA: Diagnosis not present

## 2016-09-10 MED ORDER — BUPROPION HCL ER (XL) 300 MG PO TB24: ORAL_TABLET | ORAL | 1 refills | Status: DC

## 2016-09-10 MED FILL — buPROPion HCL ER (XL) 300 M: 300 | 90 days supply | Qty: 90 | Fill #0

## 2016-09-12 ENCOUNTER — Encounter: Payer: Self-pay | Admitting: Internal Medicine

## 2016-09-12 NOTE — Progress Notes (Signed)
[place] ADULT & ADOLESCENT INTERNAL MEDICINE  Lucky CowboyWilliam Lya Holben, M.D.      Dyanne CarrelAmanda R. Steffanie Dunnollier, P.A.-C [facility] [address]  [place], [place] [phone] Telephone [phone](336) [phone] Telefax [phone]  Subjective:    Patient ID: Maria Crawford, female    DOB: 05/09/1975, 41 y.o.   MRN: [id number]  HPI  Maria SettleBrooke is a very nice 41 yo DWF who returns for 6 week f/u on Bupropion 15-0 for inattentive ADD. Patient reports significant improvement in focus/concentration as confirmed by comparison of Adult ADD Self Report scale (ASRA-v1.1). She reports improved work productivityand desires to continue treat ment. She disavows any perceived SE's.   Medication Sig  . cetirizine (ZYRTEC) 10 MG tablet Take 10 mg by mouth daily.  . magnesium cit SOLN Take 1 Bottle by mouth once.  . Multiple Vitamins-Minerals  Take 1 tablet by mouth daily.  Marland Kitchen. PROAIR HFA inhaler 1 to 2 inhalations 10-15 min apart every 4 hours as needed   . Thyroid 48.75 MG TABS Take 1 tab 2  times daily.  Marland Kitchen. buPROPion- XL 150 MG  Take 1 tab every morning.   Allergies  Allergen Reactions  . Penicillins     REACTION: hives   Past Medical History:  Diagnosis Date  . Allergy   . Asthma   . Thyroid disease    Review of Systems  10 point systems review negative except as above.    Objective:   Physical Exam  BP 100/68   Pulse 76   Temp (!) 97.5 F (36.4 C)   Resp 16   Ht 5' 3.5" (1.613 m)   Wt 118 lb 6.4 oz (53.7 kg)   BMI 20.64 kg/m   HEENT - WNL. Neck - supple.  Chest - Clear. Cor - Nl HS. RRR w/o sig M MS- FROM. Gait Nl. Neuro -  Nl w/o focal abnormalities.    Assessment & Plan:   1. Attention deficit disorder (ADD) without hyperactivity  - buPROPion (WELLBUTRIN XL) 300 MG 24 hr tablet; Take 1 tablet every morning for Focus / Concentration  Dispense: 90 tablet; Refill: 1  - Discussed meds & SE's. Discussed benefits of prudent diet and exercise.  - ROV 1 month

## 2016-09-15 MED FILL — NATURE-THROID 48.75 MG TAB: 48.75 | 30 days supply | Qty: 60 | Fill #0

## 2016-10-14 MED FILL — NATURE-THROID 48.75 MG TAB: 48.75 | 30 days supply | Qty: 60 | Fill #1

## 2016-10-15 ENCOUNTER — Ambulatory Visit (INDEPENDENT_AMBULATORY_CARE_PROVIDER_SITE_OTHER): Payer: BLUE CROSS/BLUE SHIELD | Admitting: Internal Medicine

## 2016-10-15 ENCOUNTER — Ambulatory Visit: Payer: Self-pay | Admitting: Internal Medicine

## 2016-10-15 VITALS — BP 96/62 | HR 72 | Temp 97.7°F | Resp 16 | Ht 63.5 in | Wt 117.4 lb

## 2016-10-15 DIAGNOSIS — F988 Other specified behavioral and emotional disorders with onset usually occurring in childhood and adolescence: Secondary | ICD-10-CM

## 2016-10-17 ENCOUNTER — Encounter: Payer: Self-pay | Admitting: Internal Medicine

## 2016-10-17 NOTE — Progress Notes (Signed)
[place] ADULT & ADOLESCENT INTERNAL MEDICINE   Lucky Cowboy, M.D.    Dyanne Carrel. Steffanie Dunn, P.A.-C      Judd Gaudier, DNP [facility]                [address]                [place], [place]. [address] Telephone [phone] Telefax [phone]  Subjective:    Patient ID: Maria Crawford, female    DOB: 1976-01-11, 41 y.o.   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 She feels attitude is much calmer as she is less frustrated and denies any perceived SE's.     Medication Sig  . buPROPion-XL 300 MG Take 1 tablet every morning for Focus / Concentration  . Cetirizine 10 MG tablet Take 10 mg by mouth daily.  . magnesium citrate Take 1 Bottle by mouth once.  . Multiple Vitamins-Minerals  Take 1 tablet by mouth daily.  Marland Kitchen PROAIR HFA   inhaler 1 to 2 inhalations 10-15 minutes apart every 4 hours as needed   . Thyroid 48.75 MG TABS Take 1 tablet by mouth 2 (two) times daily.   Allergies  Allergen Reactions  . Penicillins     REACTION: hives   Past Medical History:  Diagnosis Date  . Allergy   . Asthma   . Thyroid disease    Review of Systems     10 point systems review negative except as above.    Objective:   Physical Exam  BP 96/62   Pulse 72   Temp 97.7 F (36.5 C)   Resp 16   Ht 5' 3.5" (1.613 m)   Wt 117 lb 6.4 oz (53.3 kg)   BMI 20.47 kg/m   HEENT - WNL. Neck - supple.  Chest - Clear equal BS. Cor - Nl HS. RRR w/o sig MGR. PP 1(+). No edema. MS- FROM w/o deformities.  Gait Nl. Neuro -  Nl w/o focal abnormalities.    Assessment & Plan:   1. Attention deficit disorder (ADD) without hyperactivity  - Discussed meds, SE's, continue exercise (teaches Pilades) and prudent diet.

## 2016-10-28 ENCOUNTER — Ambulatory Visit (INDEPENDENT_AMBULATORY_CARE_PROVIDER_SITE_OTHER): Payer: BLUE CROSS/BLUE SHIELD | Admitting: Psychology

## 2016-10-28 DIAGNOSIS — F411 Generalized anxiety disorder: Secondary | ICD-10-CM | POA: Diagnosis not present

## 2016-11-12 MED FILL — NATURE-THROID 48.75 MG TAB: 48.75 | 30 days supply | Qty: 60 | Fill #0

## 2016-12-09 ENCOUNTER — Ambulatory Visit (INDEPENDENT_AMBULATORY_CARE_PROVIDER_SITE_OTHER): Payer: BLUE CROSS/BLUE SHIELD | Admitting: Psychology

## 2016-12-09 DIAGNOSIS — F411 Generalized anxiety disorder: Secondary | ICD-10-CM | POA: Diagnosis not present

## 2017-01-07 MED FILL — BUPROPION HCL XL 300 MG TAB: 300 | 90 days supply | Qty: 90 | Fill #1

## 2017-01-18 ENCOUNTER — Ambulatory Visit: Payer: BLUE CROSS/BLUE SHIELD | Admitting: Psychology

## 2017-01-18 ENCOUNTER — Other Ambulatory Visit: Payer: Self-pay | Admitting: Internal Medicine

## 2017-01-18 DIAGNOSIS — B349 Viral infection, unspecified: Secondary | ICD-10-CM

## 2017-01-18 MED ORDER — AZITHROMYCIN 250 MG PO TABS: ORAL_TABLET | ORAL | 1 refills | Status: DC

## 2017-01-18 MED ORDER — PREDNISONE 20 MG PO TABS: ORAL_TABLET | ORAL | 0 refills | Status: DC

## 2017-01-18 MED ORDER — PROMETHAZINE-DM 6.25-15 MG/5ML PO SYRP: ORAL_SOLUTION | ORAL | 1 refills | Status: DC

## 2017-01-18 MED FILL — AZITHROMYCIN 250 MG TAB: 250 | 5 days supply | Qty: 6 | Fill #0

## 2017-02-18 ENCOUNTER — Ambulatory Visit (INDEPENDENT_AMBULATORY_CARE_PROVIDER_SITE_OTHER): Payer: BLUE CROSS/BLUE SHIELD | Admitting: Psychology

## 2017-02-18 DIAGNOSIS — F411 Generalized anxiety disorder: Secondary | ICD-10-CM

## 2017-02-25 DIAGNOSIS — E559 Vitamin D deficiency, unspecified: Secondary | ICD-10-CM | POA: Diagnosis not present

## 2017-02-25 DIAGNOSIS — R5383 Other fatigue: Secondary | ICD-10-CM | POA: Diagnosis not present

## 2017-02-25 DIAGNOSIS — E063 Autoimmune thyroiditis: Secondary | ICD-10-CM | POA: Diagnosis not present

## 2017-02-25 DIAGNOSIS — E039 Hypothyroidism, unspecified: Secondary | ICD-10-CM | POA: Diagnosis not present

## 2017-03-02 ENCOUNTER — Ambulatory Visit: Payer: BLUE CROSS/BLUE SHIELD | Admitting: Psychology

## 2017-03-16 ENCOUNTER — Ambulatory Visit (INDEPENDENT_AMBULATORY_CARE_PROVIDER_SITE_OTHER): Payer: BLUE CROSS/BLUE SHIELD | Admitting: Psychology

## 2017-03-16 DIAGNOSIS — F411 Generalized anxiety disorder: Secondary | ICD-10-CM

## 2017-03-23 DIAGNOSIS — E039 Hypothyroidism, unspecified: Secondary | ICD-10-CM | POA: Diagnosis not present

## 2017-03-23 DIAGNOSIS — R5381 Other malaise: Secondary | ICD-10-CM | POA: Diagnosis not present

## 2017-03-23 DIAGNOSIS — R5383 Other fatigue: Secondary | ICD-10-CM | POA: Diagnosis not present

## 2017-03-23 DIAGNOSIS — E063 Autoimmune thyroiditis: Secondary | ICD-10-CM | POA: Diagnosis not present

## 2017-03-30 ENCOUNTER — Ambulatory Visit (INDEPENDENT_AMBULATORY_CARE_PROVIDER_SITE_OTHER): Payer: BLUE CROSS/BLUE SHIELD | Admitting: Psychology

## 2017-03-30 DIAGNOSIS — F411 Generalized anxiety disorder: Secondary | ICD-10-CM

## 2017-04-22 ENCOUNTER — Ambulatory Visit: Payer: Self-pay | Admitting: Internal Medicine

## 2017-04-22 NOTE — Progress Notes (Signed)
                 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       Today's  . Patient has had no complaints of any cardiac type chest pain, palpitations, dyspnea / orthopnea / PND, dizziness, claudication, or dependent edema.

## 2017-05-10 ENCOUNTER — Other Ambulatory Visit: Payer: Self-pay | Admitting: Internal Medicine

## 2017-05-10 ENCOUNTER — Ambulatory Visit (INDEPENDENT_AMBULATORY_CARE_PROVIDER_SITE_OTHER): Payer: BLUE CROSS/BLUE SHIELD | Admitting: Internal Medicine

## 2017-05-10 ENCOUNTER — Encounter: Payer: Self-pay | Admitting: Internal Medicine

## 2017-05-10 VITALS — BP 118/82 | HR 72 | Temp 97.5°F | Resp 16 | Ht 63.5 in | Wt 116.0 lb

## 2017-05-10 DIAGNOSIS — E039 Hypothyroidism, unspecified: Secondary | ICD-10-CM

## 2017-05-10 DIAGNOSIS — R739 Hyperglycemia, unspecified: Secondary | ICD-10-CM

## 2017-05-10 DIAGNOSIS — E559 Vitamin D deficiency, unspecified: Secondary | ICD-10-CM

## 2017-05-10 DIAGNOSIS — Z79899 Other long term (current) drug therapy: Secondary | ICD-10-CM | POA: Diagnosis not present

## 2017-05-10 DIAGNOSIS — J029 Acute pharyngitis, unspecified: Secondary | ICD-10-CM

## 2017-05-10 DIAGNOSIS — R5383 Other fatigue: Secondary | ICD-10-CM

## 2017-05-10 LAB — COMPLETE METABOLIC PANEL WITH GFR
AG Ratio: 1.8 (calc) (ref 1.0–2.5)
ALBUMIN MSPROF: 4.8 g/dL (ref 3.6–5.1)
ALT: 15 U/L (ref 6–29)
AST: 15 U/L (ref 10–30)
Alkaline phosphatase (APISO): 36 U/L (ref 33–115)
BUN: 15 mg/dL (ref 7–25)
CALCIUM: 9.9 mg/dL (ref 8.6–10.2)
CO2: 29 mmol/L (ref 20–32)
CREATININE: 0.94 mg/dL (ref 0.50–1.10)
Chloride: 106 mmol/L (ref 98–110)
GFR, EST AFRICAN AMERICAN: 88 mL/min/{1.73_m2} (ref >=60)
GFR, EST NON AFRICAN AMERICAN: 76 mL/min/{1.73_m2} (ref >=60)
Globulin: 2.6 g/dL (calc) (ref 1.9–3.7)
Glucose, Bld: 162 mg/dL — ABNORMAL HIGH (ref 65–99)
Potassium: 4.6 mmol/L (ref 3.5–5.3)
Sodium: 142 mmol/L (ref 135–146)
TOTAL PROTEIN: 7.4 g/dL (ref 6.1–8.1)
Total Bilirubin: 0.5 mg/dL (ref 0.2–1.2)

## 2017-05-10 LAB — CBC WITH DIFFERENTIAL/PLATELET
Basophils Absolute: 44 cells/uL (ref 0–200)
Basophils Relative: 0.5 %
EOS ABS: 87 {cells}/uL (ref 15–500)
EOS PCT: 1 %
HEMATOCRIT: 39.8 % (ref 35.0–45.0)
HEMOGLOBIN: 12.9 g/dL (ref 11.7–15.5)
LYMPHS ABS: 1575 {cells}/uL (ref 850–3900)
MCH: 25.6 pg — AB (ref 27.0–33.0)
MCHC: 32.4 g/dL (ref 32.0–36.0)
MCV: 79 fL — ABNORMAL LOW (ref 80.0–100.0)
MONOS PCT: 7 %
MPV: 11.8 fL (ref 7.5–12.5)
NEUTROS PCT: 73.4 %
Neutro Abs: 6386 cells/uL (ref 1500–7800)
Platelets: 296 10*3/uL (ref 140–400)
RBC: 5.04 10*6/uL (ref 3.80–5.10)
RDW: 12.7 % (ref 11.0–15.0)
Total Lymphocyte: 18.1 %
WBC mixed population: 609 cells/uL (ref 200–950)
WBC: 8.7 10*3/uL (ref 3.8–10.8)

## 2017-05-10 LAB — POCT RAPID STREP A (OFFICE): RAPID STREP A SCREEN: NEGATIVE

## 2017-05-10 LAB — TSH: TSH: 0.84 mIU/L

## 2017-05-10 MED ORDER — PREDNISONE 20 MG PO TABS: ORAL_TABLET | ORAL | 0 refills | Status: DC

## 2017-05-10 MED ORDER — AZITHROMYCIN 250 MG PO TABS: ORAL_TABLET | ORAL | 1 refills | Status: DC

## 2017-05-10 NOTE — Progress Notes (Signed)
  Subjective:    Patient ID: Maria Crawford, female    DOB: [birth year], 42 y.o.   MRN: [id number]  HPI   This very nice 42 yo DWF on armour thyroid presents with a 3-4 week prodrome of difficulty with her ears associated with several trips to the Dentist who has been "adjusting her bite" by filing down various molars and she also been told part of her problem is that she needs to have gum surgery. In addition to the discomfort with her ears , she also feels swollen in the "back" of her throat and she feels that she has a swollen (L) lymph node. She feels like "something " is swollen in the back of her throat. She is concerned that she might have Mono due to the period of time that she has felt fatigued. She denies prior hx/o Mono and denies any sinus or chest congestion, sinus pressure or drainage or cough. No fevers, chill, sweats.   Medication Sig  . buPROPion-XL 300 MG 24 hr tablet Take 1 tablet every morning for Focus / Concentration  . cetirizine  10 MG tablet Take  daily.  . Multiple Vitamins-Minerals  Take 1 tab daily.  . Omega-3 FISH OIL  Take 1 cap daily.  . Thyroid 48.75 MG TABS Take 1 tab 2 (two) times daily.  Marland Kitchen PROAIR HFA inhaler 1 to 2 inhalations 10-15 minutes apart every 4 hours as needed to rescue Asthma   Past Medical History:  Diagnosis Date  . Allergy   . Asthma   . Thyroid disease    No past surgical history on file.  Review of Systems  10 point systems review negative except as above.      Objective:   Physical Exam  BP 118/82   Pulse 72   Temp (!) 97.5 F (36.4 C)   Resp 16   Ht 5' 3.5" (1.613 m)   Wt 116 lb (52.6 kg)   BMI 20.23 kg/m   HEENT - EAC's / TM's Nl. No sinus tenderness. O/P 1-2 (+) injected posterior pharynx w/o exudates.  Neck - supple. Thyroid not palpable.   Chest - Clear equal BS. Cor - Nl HS. RRR w/o sig MGR. PP 1(+). No edema. MS- FROM w/o deformities.  Gait Nl. Neuro -  Nl w/o focal abnormalities. Skin - clear w/o rash, icterus.     Assessment & Plan:   1. Pharyngitis, unspecified etiology  - CBC with Differential/Platelet  - Epstein-Barr virus VCA antibody panel  - predniSONE (DELTASONE) 20 MG tablet; 1 tab 3 x day for 3 days, then 1 tab 2 x day for 3 days, then 1 tab 1 x day for 5 days  Dispense: 20 tablet  - azithromycin (ZITHROMAX) 250 MG tablet; Take 2 tablets (500 mg) on  Day 1,  followed by 1 tablet (250 mg) once daily on Days 2 through 5.  Dispense: 6 each; Refill: 1 - POCT rapid strep A  2. Fatigue, unspecified type  - CBC with Differential/Platelet - Epstein-Barr virus VCA antibody panel - TSH - COMPLETE METABOLIC PANEL WITH GFR  3. Hypothyroidism  - TSH  4. Medication management  - TSH - COMPLETE METABOLIC PANEL WITH GFR  5. Vitamin D deficiency  - VITAMIN D 25 Hydroxy

## 2017-05-11 LAB — TEST AUTHORIZATION

## 2017-05-11 LAB — EPSTEIN-BARR VIRUS VCA ANTIBODY PANEL
EBV NA IgG: <18 U/mL
EBV VCA IgG: 262 U/mL — ABNORMAL HIGH
EBV VCA IgM: <36 U/mL

## 2017-05-11 LAB — VITAMIN D 25 HYDROXY (VIT D DEFICIENCY, FRACTURES): VIT D 25 HYDROXY: 45 ng/mL (ref 30–100)

## 2017-05-12 LAB — HEMOGLOBIN A1C W/OUT EAG: Hgb A1c MFr Bld: 4.9 % of total Hgb (ref <5.7)

## 2017-05-18 ENCOUNTER — Ambulatory Visit: Payer: BLUE CROSS/BLUE SHIELD | Admitting: Psychology

## 2017-05-27 ENCOUNTER — Ambulatory Visit: Payer: Self-pay | Admitting: Internal Medicine

## 2017-07-05 ENCOUNTER — Other Ambulatory Visit: Payer: Self-pay | Admitting: Internal Medicine

## 2017-07-05 DIAGNOSIS — F988 Other specified behavioral and emotional disorders with onset usually occurring in childhood and adolescence: Secondary | ICD-10-CM

## 2017-07-05 MED FILL — buPROPion HCL ER (XL) 300 M: 300 | 90 days supply | Qty: 90 | Fill #0

## 2017-07-05 MED FILL — NATURE-THROID 48.75 MG TAB: 48.75 | 90 days supply | Qty: 180 | Fill #0

## 2017-07-12 ENCOUNTER — Ambulatory Visit: Payer: BLUE CROSS/BLUE SHIELD | Admitting: Psychology

## 2017-09-07 ENCOUNTER — Ambulatory Visit (INDEPENDENT_AMBULATORY_CARE_PROVIDER_SITE_OTHER): Payer: BLUE CROSS/BLUE SHIELD | Admitting: Psychology

## 2017-09-07 DIAGNOSIS — F411 Generalized anxiety disorder: Secondary | ICD-10-CM | POA: Diagnosis not present

## 2017-09-09 ENCOUNTER — Encounter: Payer: Self-pay | Admitting: Internal Medicine

## 2017-09-09 ENCOUNTER — Ambulatory Visit: Payer: Self-pay | Admitting: Internal Medicine

## 2017-09-09 ENCOUNTER — Ambulatory Visit (INDEPENDENT_AMBULATORY_CARE_PROVIDER_SITE_OTHER): Payer: BLUE CROSS/BLUE SHIELD | Admitting: Internal Medicine

## 2017-09-09 VITALS — BP 100/66 | HR 64 | Temp 98.6°F | Resp 16 | Ht 63.5 in | Wt 117.0 lb

## 2017-09-09 DIAGNOSIS — Z79899 Other long term (current) drug therapy: Secondary | ICD-10-CM

## 2017-09-09 DIAGNOSIS — R7309 Other abnormal glucose: Secondary | ICD-10-CM | POA: Diagnosis not present

## 2017-09-09 DIAGNOSIS — D649 Anemia, unspecified: Secondary | ICD-10-CM | POA: Diagnosis not present

## 2017-09-09 DIAGNOSIS — E039 Hypothyroidism, unspecified: Secondary | ICD-10-CM | POA: Diagnosis not present

## 2017-09-09 DIAGNOSIS — E559 Vitamin D deficiency, unspecified: Secondary | ICD-10-CM

## 2017-09-09 DIAGNOSIS — F988 Other specified behavioral and emotional disorders with onset usually occurring in childhood and adolescence: Secondary | ICD-10-CM

## 2017-09-09 DIAGNOSIS — R5383 Other fatigue: Secondary | ICD-10-CM

## 2017-09-09 MED ORDER — AMPHETAMINE-DEXTROAMPHETAMINE 20 MG PO TABS: ORAL_TABLET | ORAL | 0 refills | Status: DC

## 2017-09-09 MED FILL — DEXTROAMP-AMPHETAMIN 20 MG: 20 | 30 days supply | Qty: 60 | Fill #0

## 2017-09-09 NOTE — Patient Instructions (Addendum)
Amphetamine; Dextroamphetamine tablets What is this medicine? AMPHETAMINE; DEXTROAMPHETAMINE(am FET a meen; dex troe am FET a meen) is used to treat attention-deficit hyperactivity disorder (ADHD). It may also be used for narcolepsy. Federal law prohibits giving this medicine to any person other than the person for whom it was prescribed. Do not share this medicine with anyone else. This medicine may be used for other purposes; ask your health care provider or pharmacist if you have questions. COMMON BRAND NAME(S): Adderall What should I tell my health care provider before I take this medicine? They need to know if you have any of these conditions: -anxiety or panic attacks -circulation problems in fingers and toes -glaucoma -hardening or blockages of the arteries or heart blood vessels -heart disease or a heart defect -high blood pressure -history of a drug or alcohol abuse problem -history of stroke -kidney disease -liver disease -mental illness -seizures -suicidal thoughts, plans, or attempt; a previous suicide attempt by you or a family member -thyroid disease -Tourette's syndrome -an unusual or allergic reaction to dextroamphetamine, other amphetamines, other medicines, foods, dyes, or preservatives -pregnant or trying to get pregnant -breast-feeding How should I use this medicine? Take this medicine by mouth with a glass of water. Follow the directions on the prescription label. Take your doses at regular intervals. Do not take your medicine more often than directed. Do not suddenly stop your medicine. You must gradually reduce the dose or you may feel withdrawal effects. Ask your doctor or health care professional for advice. Talk to your pediatrician regarding the use of this medicine in children. Special care may be needed. While this drug may be prescribed for children as young as 3 years for selected conditions, precautions do apply. Overdosage: If you think you have taken too  much of this medicine contact a poison control center or emergency room at once. NOTE: This medicine is only for you. Do not share this medicine with others. What if I miss a dose? If you miss a dose, take it as soon as you can. If it is almost time for your next dose, take only that dose. Do not take double or extra doses. What may interact with this medicine? Do not take this medicine with any of the following medications: -MAOIS like Carbex, Eldepryl, Marplan, Nardil, and Parnate -other stimulant medicines for attention disorders, weight loss, or to stay awake This medicine may also interact with the following medications: -acetazolamide -ammonium chloride -antacids -ascorbic acid -atomoxetine -caffeine -certain medicines for blood pressure -certain medicines for depression, anxiety, or psychotic disturbances -certain medicines for seizures like carbamazepine, phenobarbital, phenytoin -certain medicines for stomach problems like cimetidine, famotidine, omeprazole, lansoprazole -cold or allergy medicines -glutamic acid -lithium -meperidine -methenamine; sodium acid phosphate -narcotic medicines for pain -norepinephrine -phenothiazines like chlorpromazine, mesoridazine, prochlorperazine, thioridazine -sodium acid phosphate -sodium bicarbonate This list may not describe all possible interactions. Give your health care provider a list of all the medicines, herbs, non-prescription drugs, or dietary supplements you use. Also tell them if you smoke, drink alcohol, or use illegal drugs. Some items may interact with your medicine. What should I watch for while using this medicine? Visit your doctor or health care professional for regular checks on your progress. This prescription requires that you follow special procedures with your doctor and pharmacy. You will need to have a new written prescription from your doctor every time you need a refill. This medicine may affect your  concentration, or hide signs of tiredness. Until you know how this   medicine affects you, do not drive, ride a bicycle, use machinery, or do anything that needs mental alertness. Tell your doctor or health care professional if this medicine loses its effects, or if you feel you need to take more than the prescribed amount. Do not change the dosage without talking to your doctor or health care professional. Decreased appetite is a common side effect when starting this medicine. Eating small, frequent meals or snacks can help. Talk to your doctor if you continue to have poor eating habits. Height and weight growth of a child taking this medicine will be monitored closely. Do not take this medicine close to bedtime. It may prevent you from sleeping. If you are going to need surgery, a MRI, CT scan, or other procedure, tell your doctor that you are taking this medicine. You may need to stop taking this medicine before the procedure. Tell your doctor or healthcare professional right away if you notice unexplained wounds on your fingers and toes while taking this medicine. You should also tell your healthcare provider if you experience numbness or pain, changes in the skin color, or sensitivity to temperature in your fingers or toes. What side effects may I notice from receiving this medicine? Side effects that you should report to your doctor or health care professional as soon as possible: -allergic reactions like skin rash, itching or hives, swelling of the face, lips, or tongue -changes in vision -chest pain or chest tightness -confusion, trouble speaking or understanding -fast, irregular heartbeat -fingers or toes feel numb, cool, painful -hallucination, loss of contact with reality -high blood pressure -males: prolonged or painful erection -seizures -severe headaches -shortness of breath -suicidal thoughts or other mood changes -trouble walking, dizziness, loss of balance or  coordination -uncontrollable head, mouth, neck, arm, or leg movements Side effects that usually do not require medical attention (report to your doctor or health care professional if they continue or are bothersome): -anxious -headache -loss of appetite -nausea, vomiting -trouble sleeping -weight loss This list may not describe all possible side effects. Call your doctor for medical advice about side effects. You may report side effects to FDA at 1-800-FDA-1088. Where should I keep my medicine? Keep out of the reach of children. This medicine can be abused. Keep your medicine in a safe place to protect it from theft. Do not share this medicine with anyone. Selling or giving away this medicine is dangerous and against the law. Store at room temperature between 15 and 30 degrees C (59 and 86 degrees F). Keep container tightly closed. Throw away any unused medicine after the expiration date. Dispose of properly. This medicine may cause accidental overdose and death if it is taken by other adults, children, or pets. Mix any unused medicine with a substance like cat litter or coffee grounds. Then throw the medicine away in a sealed container like a sealed bag or a coffee can with a lid. Do not use the medicine after the expiration date. NOTE: This sheet is a summary. It may not cover all possible information. If you have questions about this medicine, talk to your doctor, pharmacist, or health care provider.  2018 Elsevier/Gold Standard (2013-11-01 18:44:41)  +++++++++++++++++++++++++++++++++ Recommend Adult Low Dose Aspirin or  coated  Aspirin 81 mg daily  To reduce risk of Colon Cancer 20 %,  Skin Cancer 26 % ,  Melanoma 46%  and  Pancreatic cancer 60% +++++++++++++++++++++++++ Vitamin D goal  is between 70-100.  Please make sure that you are taking your  Vitamin D as directed.  It is very important as a natural anti-inflammatory  helping hair, skin, and nails, as well as reducing stroke  and heart attack risk.  It helps your bones and helps with mood. It also decreases numerous cancer risks so please take it as directed.  Low Vit D is associated with a 200-300% higher risk for CANCER  and 200-300% higher risk for HEART   ATTACK  &  STROKE.   .....................................Marland Kitchen. It is also associated with higher death rate at younger ages,  autoimmune diseases like Rheumatoid arthritis, Lupus, Multiple Sclerosis.    Also many other serious conditions, like depression, Alzheimer's Dementia, infertility, muscle aches, fatigue, fibromyalgia - just to name a few. ++++++++++++++++++++ Recommend the book "The END of DIETING" by Dr Monico HoarJoel Fuhrman  & the book "The END of DIABETES " by Dr Monico HoarJoel Fuhrman At [email or web address] - get book & Audio CD's    Being diabetic has a  300% increased risk for heart attack, stroke, cancer, and alzheimer- type vascular dementia. It is very important that you work harder with diet by avoiding all foods that are white. Avoid white rice (brown & wild rice is OK), white potatoes (sweetpotatoes in moderation is OK), White bread or wheat bread or anything made out of white flour like bagels, donuts, rolls, buns, biscuits, cakes, pastries, cookies, pizza crust, and pasta (made from white flour & egg whites) - vegetarian pasta or spinach or wheat pasta is OK. Multigrain breads like Arnold's or Pepperidge Farm, or multigrain sandwich thins or flatbreads.  Diet, exercise and weight loss can reverse and cure diabetes in the early stages.  Diet, exercise and weight loss is very important in the control and prevention of complications of diabetes which affects every system in your body, ie. Brain - dementia/stroke, eyes - glaucoma/blindness, heart - heart attack/heart failure, kidneys - dialysis, stomach - gastric paralysis, intestines - malabsorption, nerves - severe painful neuritis, circulation - gangrene & loss of a leg(s), and finally cancer and Alzheimers.    I recommend  avoid fried & greasy foods,  sweets/candy, white rice (brown or wild rice or Quinoa is OK), white potatoes (sweet potatoes are OK) - anything made from white flour - bagels, doughnuts, rolls, buns, biscuits,white and wheat breads, pizza crust and traditional pasta made of white flour & egg white(vegetarian pasta or spinach or wheat pasta is OK).  Multi-grain bread is OK - like multi-grain flat bread or sandwich thins. Avoid alcohol in excess. Exercise is also important.    Eat all the vegetables you want - avoid meat, especially red meat and dairy - especially cheese.  Cheese is the most concentrated form of trans-fats which is the worst thing to clog up our arteries. Veggie cheese is OK which can be found in the fresh produce section at [employer] or [employer] or [employer]  +++++++++++++++++++++ DASH Eating Plan  DASH stands for "Dietary Approaches to Stop Hypertension."   The DASH eating plan is a healthy eating plan that has been shown to reduce high blood pressure (hypertension). Additional health benefits may include reducing the risk of type 2 diabetes mellitus, heart disease, and stroke. The DASH eating plan may also help with weight loss. WHAT DO I NEED TO KNOW ABOUT THE DASH EATING PLAN? For the DASH eating plan, you will follow these general guidelines:  Choose foods with a percent daily value for sodium of less than 5% (as listed on the food label).  Use salt-free seasonings or herbs instead  of table salt or sea salt.  Check with your health care provider or pharmacist before using salt substitutes.  Eat lower-sodium products, often labeled as "lower sodium" or "no salt added."  Eat fresh foods.  Eat more vegetables, fruits, and low-fat dairy products.  Choose whole grains. Look for the word "whole" as the first word in the ingredient list.  Choose fish   Limit sweets, desserts, sugars, and sugary drinks.  Choose heart-healthy fats.  Eat veggie cheese   Eat more  home-cooked food and less restaurant, buffet, and fast food.  Limit fried foods.  Cook foods using methods other than frying.  Limit canned vegetables. If you do use them, rinse them well to decrease the sodium.  When eating at a restaurant, ask that your food be prepared with less salt, or no salt if possible.                      WHAT FOODS CAN I EAT? Read Dr Francis Dowse Fuhrman's books on The End of Dieting & The End of Diabetes  Grains Whole grain or whole wheat bread. Brown rice. Whole grain or whole wheat pasta. Quinoa, bulgur, and whole grain cereals. Low-sodium cereals. Corn or whole wheat flour tortillas. Whole grain cornbread. Whole grain crackers. Low-sodium crackers.  Vegetables Fresh or frozen vegetables (raw, steamed, roasted, or grilled). Low-sodium or reduced-sodium tomato and vegetable juices. Low-sodium or reduced-sodium tomato sauce and paste. Low-sodium or reduced-sodium canned vegetables.   Fruits All fresh, canned (in natural juice), or frozen fruits.  Protein Products  All fish and seafood.  Dried beans, peas, or lentils. Unsalted nuts and seeds. Unsalted canned beans.  Dairy Low-fat dairy products, such as skim or 1% milk, 2% or reduced-fat cheeses, low-fat ricotta or cottage cheese, or plain low-fat yogurt. Low-sodium or reduced-sodium cheeses.  Fats and Oils Tub margarines without trans fats. Light or reduced-fat mayonnaise and salad dressings (reduced sodium). Avocado. Safflower, olive, or canola oils. Natural peanut or almond butter.  Other Unsalted popcorn and pretzels. The items listed above may not be a complete list of recommended foods or beverages. Contact your dietitian for more options.  +++++++++++++++  WHAT FOODS ARE NOT RECOMMENDED? Grains/ White flour or wheat flour White bread. White pasta. White rice. Refined cornbread. Bagels and croissants. Crackers that contain trans fat.  Vegetables  Creamed or fried vegetables. Vegetables in a .  Regular canned vegetables. Regular canned tomato sauce and paste. Regular tomato and vegetable juices.  Fruits Dried fruits. Canned fruit in light or heavy syrup. Fruit juice.  Meat and Other Protein Products Meat in general - RED meat & White meat.  Fatty cuts of meat. Ribs, chicken wings, all processed meats as bacon, sausage, bologna, salami, fatback, hot dogs, bratwurst and packaged luncheon meats.  Dairy Whole or 2% milk, cream, half-and-half, and cream cheese. Whole-fat or sweetened yogurt. Full-fat cheeses or blue cheese. Non-dairy creamers and whipped toppings. Processed cheese, cheese spreads, or cheese curds.  Condiments Onion and garlic salt, seasoned salt, table salt, and sea salt. Canned and packaged gravies. Worcestershire sauce. Tartar sauce. Barbecue sauce. Teriyaki sauce. Soy sauce, including reduced sodium. Steak sauce. Fish sauce. Oyster sauce. Cocktail sauce. Horseradish. Ketchup and mustard. Meat flavorings and tenderizers. Bouillon cubes. Hot sauce. Tabasco sauce. Marinades. Taco seasonings. Relishes.  Fats and Oils Butter, stick margarine, lard, shortening and bacon fat. Coconut, palm kernel, or palm oils. Regular salad dressings.  Pickles and olives. Salted popcorn and pretzels.  The items listed above  may not be a complete list of foods and beverages to avoid.

## 2017-09-10 LAB — COMPLETE METABOLIC PANEL WITH GFR
AG Ratio: 1.6 (calc) (ref 1.0–2.5)
ALKALINE PHOSPHATASE (APISO): 36 U/L (ref 33–115)
ALT: 14 U/L (ref 6–29)
AST: 17 U/L (ref 10–30)
Albumin: 4.5 g/dL (ref 3.6–5.1)
BUN: 15 mg/dL (ref 7–25)
CO2: 28 mmol/L (ref 20–32)
CREATININE: 0.94 mg/dL (ref 0.50–1.10)
Calcium: 9.8 mg/dL (ref 8.6–10.2)
Chloride: 105 mmol/L (ref 98–110)
GFR, Est African American: 88 mL/min/{1.73_m2} (ref >=60)
GFR, Est Non African American: 76 mL/min/{1.73_m2} (ref >=60)
GLOBULIN: 2.9 g/dL (ref 1.9–3.7)
Glucose, Bld: 80 mg/dL (ref 65–99)
Potassium: 4 mmol/L (ref 3.5–5.3)
SODIUM: 142 mmol/L (ref 135–146)
Total Bilirubin: 0.5 mg/dL (ref 0.2–1.2)
Total Protein: 7.4 g/dL (ref 6.1–8.1)

## 2017-09-10 LAB — CBC WITH DIFFERENTIAL/PLATELET
BASOS PCT: 0.5 %
Basophils Absolute: 41 cells/uL (ref 0–200)
EOS PCT: 3.8 %
Eosinophils Absolute: 312 cells/uL (ref 15–500)
HEMATOCRIT: 40.1 % (ref 35.0–45.0)
HEMOGLOBIN: 12.8 g/dL (ref 11.7–15.5)
LYMPHS ABS: 1771 {cells}/uL (ref 850–3900)
MCH: 25.6 pg — ABNORMAL LOW (ref 27.0–33.0)
MCHC: 31.9 g/dL — ABNORMAL LOW (ref 32.0–36.0)
MCV: 80.2 fL (ref 80.0–100.0)
MPV: 11.5 fL (ref 7.5–12.5)
Monocytes Relative: 7.6 %
NEUTROS ABS: 5453 {cells}/uL (ref 1500–7800)
Neutrophils Relative %: 66.5 %
Platelets: 275 10*3/uL (ref 140–400)
RBC: 5 10*6/uL (ref 3.80–5.10)
RDW: 13.2 % (ref 11.0–15.0)
Total Lymphocyte: 21.6 %
WBC mixed population: 623 cells/uL (ref 200–950)
WBC: 8.2 10*3/uL (ref 3.8–10.8)

## 2017-09-10 LAB — HEMOGLOBIN A1C
HEMOGLOBIN A1C: 4.8 %{Hb} (ref <5.7)
Mean Plasma Glucose: 91 (calc)
eAG (mmol/L): 5 (calc)

## 2017-09-10 LAB — TSH: TSH: 1.65 m[IU]/L

## 2017-09-10 LAB — VITAMIN D 25 HYDROXY (VIT D DEFICIENCY, FRACTURES): Vit D, 25-Hydroxy: 61 ng/mL (ref 30–100)

## 2017-09-13 ENCOUNTER — Encounter: Payer: Self-pay | Admitting: Internal Medicine

## 2017-09-13 NOTE — Progress Notes (Signed)
  Subjective:    Patient ID: Maria Crawford, female    DOB: 05/06/1975, 42 y.o.   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 Admits difficulty staying on task and distracts easily. At last labs did have a non-fasting elevated Glucose and also had a low Vit D & is on Vit D 5,000.   Medication Sig  . WELLBUTRIN XL 300 MG XL TAKE 1 TABLET EVERY MORNING FOR FOCUS / CONCENTRATION  . cetirizine  10 MG tablet Take 10 mg  daily.  . Multiple Vitamins-Minerals  Take 1 tablet  daily.  . Omega-3FISH OIL  Take 1 capsule  daily.  . Thyroid 48.75 MG TABS Take 1 tablet 2  times daily.  Marland Kitchen PROAIR HFAMCG/ACT inhaler 1 to 2 inhalations 10-15 minutes apart every 4 hours as needed    Allergies  Allergen Reactions  . Penicillins     REACTION: hives   Review of Systems   10 point systems review negative except as above.    Objective:   Physical Exam  BP 100/66   Pulse 64   Temp 98.6 F (37 C)   Resp 16   Ht 5' 3.5" (1.613 m)   Wt 117 lb (53.1 kg)   SpO2 96%   BMI 20.40 kg/m   HEENT - WNL. Neck - supple.  Chest - Clear equal BS. Cor - Nl HS. RRR w/o sig MGR. PP 1(+). No edema. MS- FROM w/o deformities.  Gait Nl. Neuro -  Nl w/o focal abnormalities.    Assessment & Plan:   1. Attention deficit disorder (ADD) without hyperactivity  - amphetamine-dextroamphetamine (ADDERALL) 20 MG tablet; Take 1/2 to 1 tablet 1 or 2 x /day as needed for Focus & Concentration  Dispense: 60 tablet; Refill: 0  2. Anemia  - CBC with Differential/Platelet  3. Hypothyroidism  - TSH  4. Abnormal glucose  - Hemoglobin A1c  5. Vitamin D deficiency  - VITAMIN D 25 Hydroxy  6. Fatiguee  - COMPLETE METABOLIC PANEL WITH GFR  7. Medication management  - CBC with Differential/Platelet - COMPLETE METABOLIC PANEL WITH GFR - TSH - Hemoglobin A1c - VITAMIN D 25 Hydroxy

## 2017-09-14 ENCOUNTER — Encounter: Payer: Self-pay | Admitting: *Deleted

## 2017-10-09 ENCOUNTER — Other Ambulatory Visit: Payer: Self-pay | Admitting: Internal Medicine

## 2017-10-09 DIAGNOSIS — F988 Other specified behavioral and emotional disorders with onset usually occurring in childhood and adolescence: Secondary | ICD-10-CM

## 2017-10-09 MED ORDER — BUPROPION HCL ER (XL) 300 MG PO TB24: ORAL_TABLET | ORAL | 3 refills | Status: DC

## 2017-10-12 MED FILL — NATURE-THROID 48.75 MG TAB: 48.75 | 90 days supply | Qty: 180 | Fill #1

## 2017-10-16 ENCOUNTER — Other Ambulatory Visit: Payer: Self-pay | Admitting: Internal Medicine

## 2017-10-16 MED ORDER — CIPROFLOXACIN HCL 250 MG PO TABS: 250.0000 mg | ORAL_TABLET | Freq: Two times a day (BID) | ORAL | 0 refills | Status: AC

## 2017-11-07 ENCOUNTER — Other Ambulatory Visit: Payer: Self-pay | Admitting: Internal Medicine

## 2017-11-07 DIAGNOSIS — J4520 Mild intermittent asthma, uncomplicated: Secondary | ICD-10-CM

## 2017-11-07 MED ORDER — FLUTICASONE-UMECLIDIN-VILANT 100-62.5-25 MCG/INH IN AEPB: 1.0000 | INHALATION_SPRAY | Freq: Every day | RESPIRATORY_TRACT | 3 refills | Status: DC

## 2017-11-23 ENCOUNTER — Other Ambulatory Visit: Payer: Self-pay | Admitting: Internal Medicine

## 2017-11-23 MED ORDER — PREDNISONE 20 MG PO TABS: ORAL_TABLET | ORAL | 0 refills | Status: DC

## 2017-11-23 MED ORDER — TRAMADOL HCL 50 MG PO TABS: ORAL_TABLET | ORAL | 0 refills | Status: DC

## 2017-11-23 MED ORDER — LEVOFLOXACIN 500 MG PO TABS: ORAL_TABLET | ORAL | 0 refills | Status: DC

## 2017-11-24 ENCOUNTER — Ambulatory Visit (INDEPENDENT_AMBULATORY_CARE_PROVIDER_SITE_OTHER): Payer: BLUE CROSS/BLUE SHIELD | Admitting: Internal Medicine

## 2017-11-24 ENCOUNTER — Encounter: Payer: Self-pay | Admitting: Internal Medicine

## 2017-11-24 VITALS — BP 110/80 | HR 87 | Temp 97.3°F | Resp 16 | Ht 63.5 in | Wt 117.6 lb

## 2017-11-24 DIAGNOSIS — F988 Other specified behavioral and emotional disorders with onset usually occurring in childhood and adolescence: Secondary | ICD-10-CM

## 2017-11-24 DIAGNOSIS — J039 Acute tonsillitis, unspecified: Secondary | ICD-10-CM

## 2017-11-24 DIAGNOSIS — J029 Acute pharyngitis, unspecified: Secondary | ICD-10-CM

## 2017-11-24 LAB — POCT RAPID STREP A (OFFICE): Rapid Strep A Screen: NEGATIVE

## 2017-11-24 MED ORDER — METHYLPHENIDATE HCL 5 MG PO TABS: ORAL_TABLET | ORAL | 0 refills | Status: DC

## 2017-11-24 NOTE — Progress Notes (Signed)
  Subjective:    Patient ID: Maria HauserAnn B Xin, female    DOB: 11/13/1975, 42 y.o.   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 Denies sinus/chest  congestion, cough or sputum. No rash or dyspnea. She called yesterday after taking 2 Azithromycin 250 mg. Rx for Levaquin & Prednisone was sent in yesterday but not started yet.     Also long discussion of Wellbutrin benefiting her focus & concentration . Also tried Adderal, but didn't  Like the way it made her feel. She would like to try Ritalin.   Medication Sig  . buPROPion-XL 300 MG 24 hr tablet Take 1 Tablet every morning for Focus & Concentration  . cetirizine 10 MG tablet Take 10 mg by mouth daily.  . TRELEGY ELLIPTA 100-62.5-25  Inhale 1 puff into the lungs daily.  Marland Kitchen. levofloxacin  500 MG tablet Take 1 tablet daily with food for infection  . Multiple Vitamins-Minerals Take 1 tablet by mouth daily.  . Omega-3 FISH OIL  Take 1 capsule by mouth daily.  . predniSONE  20 MG tablet 1 tab 3 x day 2 days, then 2 x day 2 days, then 1 x day 3 days  . Thyroid 48.75 MG TABS Take 1 tablet by mouth 2 (two) times daily.  . traMADol  50 MG tablet Take 1/2 to 1 tablet every 4 hours if needed for Pain  . PROAIR HFA  inhaler 1 to 2 inhalations 10-15 minutes apart every 4 hours as needed to rescue Asthma   No facility-administered medications prior to visit.    Allergies  Allergen Reactions  . Penicillins     REACTION: hives    Allergies  Allergen Reactions  . Penicillins     REACTION: hives   Past Medical History:  Diagnosis Date  . Allergy   . Asthma   . Thyroid disease    Review of Systems    10 point systems review negative except as above.    Objective:   Physical Exam  BP 110/80   Pulse 87   Temp (!) 97.3 F (36.3 C)   Resp 16   Ht 5' 3.5" (1.613 m)   Wt 117 lb 9.6 oz (53.3 kg)   SpO2 99%   BMI 20.51 kg/m   No Rash. No Stridor   HEENT - EAC's / TM's -  Nl. No Sinus tenderness. O/P 2 + injected with tonsils 3+ injected w/ exudate.  Neck - supple. (+) tender sub-mandibular LN's. Chest - Clear equal BS. Cor - Nl HS. RRR w/o sig m. No edema. MS- FROM w/o deformities.  Gait Nl. Neuro -  Nl w/o focal abnormalities.    Assessment & Plan:    1. Sore throat  - POCT rapid strep A - NEGATIVE  - Advised start the Levaquin & Prednisone.  2. Attention deficit disorder (ADD) without hyperactivity  - Rx Ritalin 5 mg # 90 - take 2-3 x/day & call to report benefit in 2-3 weeks.

## 2017-11-29 ENCOUNTER — Other Ambulatory Visit: Payer: Self-pay | Admitting: Internal Medicine

## 2017-11-29 ENCOUNTER — Ambulatory Visit (INDEPENDENT_AMBULATORY_CARE_PROVIDER_SITE_OTHER): Payer: BLUE CROSS/BLUE SHIELD

## 2017-11-29 DIAGNOSIS — R5383 Other fatigue: Secondary | ICD-10-CM

## 2017-11-29 DIAGNOSIS — J029 Acute pharyngitis, unspecified: Secondary | ICD-10-CM

## 2017-11-29 DIAGNOSIS — B349 Viral infection, unspecified: Secondary | ICD-10-CM

## 2017-11-29 DIAGNOSIS — J40 Bronchitis, not specified as acute or chronic: Secondary | ICD-10-CM

## 2017-11-29 LAB — CBC WITH DIFFERENTIAL/PLATELET
BASOS PCT: 0.3 %
Basophils Absolute: 23 cells/uL (ref 0–200)
EOS ABS: 30 {cells}/uL (ref 15–500)
Eosinophils Relative: 0.4 %
HEMATOCRIT: 37.4 % (ref 35.0–45.0)
Hemoglobin: 12.1 g/dL (ref 11.7–15.5)
LYMPHS ABS: 1072 {cells}/uL (ref 850–3900)
MCH: 25.5 pg — AB (ref 27.0–33.0)
MCHC: 32.4 g/dL (ref 32.0–36.0)
MCV: 78.9 fL — AB (ref 80.0–100.0)
MPV: 11.3 fL (ref 7.5–12.5)
Monocytes Relative: 7.7 %
Neutro Abs: 5890 cells/uL (ref 1500–7800)
Neutrophils Relative %: 77.5 %
Platelets: 256 10*3/uL (ref 140–400)
RBC: 4.74 10*6/uL (ref 3.80–5.10)
RDW: 12.8 % (ref 11.0–15.0)
Total Lymphocyte: 14.1 %
WBC: 7.6 10*3/uL (ref 3.8–10.8)
WBCMIX: 585 {cells}/uL (ref 200–950)

## 2017-11-29 LAB — COMPLETE METABOLIC PANEL WITH GFR
AG Ratio: 1.4 (calc) (ref 1.0–2.5)
ALBUMIN MSPROF: 3.9 g/dL (ref 3.6–5.1)
ALKALINE PHOSPHATASE (APISO): 35 U/L (ref 33–115)
ALT: 14 U/L (ref 6–29)
AST: 12 U/L (ref 10–30)
BUN: 11 mg/dL (ref 7–25)
CALCIUM: 9.3 mg/dL (ref 8.6–10.2)
CO2: 30 mmol/L (ref 20–32)
CREATININE: 0.9 mg/dL (ref 0.50–1.10)
Chloride: 98 mmol/L (ref 98–110)
GFR, EST NON AFRICAN AMERICAN: 80 mL/min/{1.73_m2} (ref >=60)
GFR, Est African American: 93 mL/min/{1.73_m2} (ref >=60)
GLOBULIN: 2.8 g/dL (ref 1.9–3.7)
GLUCOSE: 145 mg/dL — AB (ref 65–99)
Potassium: 3.5 mmol/L (ref 3.5–5.3)
SODIUM: 139 mmol/L (ref 135–146)
Total Bilirubin: 0.2 mg/dL (ref 0.2–1.2)
Total Protein: 6.7 g/dL (ref 6.1–8.1)

## 2017-11-29 NOTE — Progress Notes (Signed)
Patient presents to the office for a nurse visit to have labs done. Patient complains of having a sore throat, lethargy and sleeplessness x 8 days. Currently being treated with Levaquin and Prednisone. Not feeling any better, feeling worse.

## 2017-11-30 ENCOUNTER — Ambulatory Visit: Payer: Self-pay

## 2017-12-01 LAB — LEGIONELLA ANTIGEN, URINE
LEGIONELLA ANTIGEN, URINE: NOT DETECTED
MICRO NUMBER:: 91388446
SPECIMEN QUALITY: ADEQUATE

## 2017-12-01 LAB — CULTURE, UPPER RESPIRATORY
MICRO NUMBER:: 91387575
SPECIMEN QUALITY: ADEQUATE

## 2017-12-01 LAB — EPSTEIN-BARR VIRUS VCA ANTIBODY PANEL
EBV NA IgG: <18 U/mL
EBV VCA IgG: 239 U/mL — ABNORMAL HIGH

## 2017-12-01 LAB — MYCOPLASMA PNEUMONIAE AB, IGM/IGG
M. pneumoniae Ab, IgG: 3.86 — ABNORMAL HIGH (ref <=0.90)
Mycoplasma pneumo IgM: 163 U/mL (ref <770)

## 2017-12-07 ENCOUNTER — Ambulatory Visit (INDEPENDENT_AMBULATORY_CARE_PROVIDER_SITE_OTHER): Payer: BLUE CROSS/BLUE SHIELD | Admitting: Internal Medicine

## 2017-12-07 VITALS — BP 104/60 | HR 64 | Temp 97.7°F | Resp 16 | Ht 63.5 in | Wt 113.4 lb

## 2017-12-07 DIAGNOSIS — J039 Acute tonsillitis, unspecified: Secondary | ICD-10-CM

## 2017-12-07 DIAGNOSIS — J029 Acute pharyngitis, unspecified: Secondary | ICD-10-CM | POA: Diagnosis not present

## 2017-12-07 DIAGNOSIS — B370 Candidal stomatitis: Secondary | ICD-10-CM | POA: Diagnosis not present

## 2017-12-08 ENCOUNTER — Other Ambulatory Visit: Payer: Self-pay | Admitting: Internal Medicine

## 2017-12-08 DIAGNOSIS — B370 Candidal stomatitis: Secondary | ICD-10-CM

## 2017-12-08 MED ORDER — FLUCONAZOLE 150 MG PO TABS: ORAL_TABLET | ORAL | 1 refills | Status: DC

## 2017-12-08 MED ORDER — NYSTATIN 100000 UNIT/ML MT SUSP: OROMUCOSAL | 1 refills | Status: DC

## 2017-12-09 ENCOUNTER — Other Ambulatory Visit: Payer: Self-pay | Admitting: Internal Medicine

## 2017-12-09 DIAGNOSIS — R110 Nausea: Secondary | ICD-10-CM

## 2017-12-09 MED ORDER — ONDANSETRON 8 MG PO TBDP: ORAL_TABLET | ORAL | 0 refills | Status: DC

## 2017-12-11 ENCOUNTER — Encounter: Payer: Self-pay | Admitting: Internal Medicine

## 2017-12-11 MED ORDER — FLUCONAZOLE 150 MG PO TABS: ORAL_TABLET | ORAL | 1 refills | Status: DC

## 2017-12-11 NOTE — Progress Notes (Signed)
  Subjective:    Patient ID: Maria HauserAnn B Zuckerman, female    DOB: 02/18/1975, 42 y.o.   MRN: [id number]  HPI   Patient is a nice 42 yo DWF who returns for 2sd OV since 11/24/17  treated initially with a Z-Pak and at OV w/negative rapid Strep she was rx'd Levaquin / Prednisone and with failure to improve on 11/29/17, she returned for labs including Mycoplasma, Legionella negative for acute Mono, negative URI culture & CBC w/ nl WBC / Diff. And she persists with posterior pharyngeal discomfort and also lower neck discomfort.  Denies fevers, chills, sweats, rash or dyspnea. Has a non-productive cough.  Medication Sig  . buPROPion (WELLBUTRIN XL) 300 MG 24 hr tablet Take 1 Tablet every morning for Focus & Concentration  . cetirizine (ZYRTEC) 10 MG tablet Take 10 mg by mouth daily.  . Fluticasone-Umeclidin-Vilant (TRELEGY ELLIPTA) 100-62.5-25 MCG/INH AEPB Inhale 1 puff into the lungs daily.  . methylphenidate (RITALIN) 5 MG tablet Take 1 tablet 2 - 3 x /day for ADD  . Multiple Vitamins-Minerals (MULTIVITAMIN WITH MINERALS) tablet Take 1 tablet by mouth daily.  . Omega-3 Fatty Acids (FISH OIL PO) Take 1 capsule by mouth daily.  . Thyroid 48.75 MG TABS Take 1 tablet by mouth 2 (two) times daily.  . traMADol (ULTRAM) 50 MG tablet Take 1/2 to 1 tablet every 4 hours if needed for Pain  . PROAIR HFA 108 (90 Base) MCG/ACT inhaler 1 to 2 inhalations 10-15 minutes apart every 4 hours as needed to rescue Asthma  . levofloxacin (LEVAQUIN) 500 MG tablet Take 1 tablet daily with food for infection  . predniSONE (DELTASONE) 20 MG tablet 1 tab 3 x day for 2 days, then 1 tab 2 x day for 2 days, then 1 tab 1 x day for 3 days   Allergies  Allergen Reactions  . Penicillins     REACTION: hives   Past Medical History:  Diagnosis Date  . Allergy   . Asthma   . Thyroid disease    Review of Systems    10 point systems review negative except as above.   Objective:   Physical Exam   BP 104/60   Pulse 64   Temp 97.7 F  (36.5 C)   Resp 16   Ht 5' 3.5" (1.613 m)   Wt 113 lb 6.4 oz (51.4 kg)   BMI 19.77 kg/m   In No Distress. No stridor. Speech sl hoarse. Cough dry/tracheal.   HEENT - Eac's patent. TM's Nl. EOM's full. PERRLA.  NasoOroPharynx thrush type exudates of posterior soft palate and posterior pharynx. Tonsils 1-2 + hypertrophic.  Fronto-maxillary sinuses - non tender Neck - supple. Nl Thyroid. No sig Cx LN's.  Chest - Clear equal BS w/o rales, rhonchi, wheezes. Cor - Nl HS. RRR w/o sig m. No edema. Abd - No palpable organomegaly, masses or tenderness. BS nl. MS- FROM w/o deformities. Muscle power, tone and bulk Nl. Gait Nl. Neuro - No obvious Cr N abnormalities. Nl w/o focal abnormalities. Skin - exposed clear w/o rash cyanosis or icterus.    Assessment & Plan:   1. Pharyngitis, unspecified etiology  2. Tonsillitis  3. Oropharyngeal candidiasis  - fluconazole (DIFLUCAN) 150 MG tablet; Take 1 tablet daily for candidiasis  Dispense: 14 tablet; Refill: 1

## 2018-01-07 MED FILL — NATURE-THROID 48.75 MG TAB: 48.75 | 30 days supply | Qty: 60 | Fill #0

## 2018-02-10 MED FILL — NATURE-THROID 48.75 MG TAB: 48.75 | 30 days supply | Qty: 60 | Fill #1

## 2018-03-08 ENCOUNTER — Other Ambulatory Visit: Payer: Self-pay | Admitting: Internal Medicine

## 2018-03-08 DIAGNOSIS — F988 Other specified behavioral and emotional disorders with onset usually occurring in childhood and adolescence: Secondary | ICD-10-CM

## 2018-03-08 MED ORDER — METHYLPHENIDATE HCL 5 MG PO TABS: ORAL_TABLET | ORAL | 0 refills | Status: DC

## 2018-03-15 MED FILL — NATURE-THROID 48.75 MG TAB: 48.75 | 30 days supply | Qty: 60 | Fill #0

## 2018-03-17 ENCOUNTER — Ambulatory Visit: Payer: Self-pay | Admitting: Internal Medicine

## 2018-04-15 MED FILL — NATURE-THROID 48.75 MG TAB: 48.75 | 30 days supply | Qty: 60 | Fill #0

## 2018-04-27 NOTE — Progress Notes (Signed)
THIS ENCOUNTER IS A VIRTUAL VISIT DUE TO COVID-19 - PATIENT WAS NOT SEEN IN THE OFFICE.  PATIENT HAS CONSENTED TO VIRTUAL VISIT / TELEMEDICINE VISIT  This provider placed a call to Olin HauserAnn B Fera using telephone, her appointment was changed to a virtual office visit to reduce the risk of exposure to the COVID-19 virus and to help Olin HauserAnn B Searcy remain healthy and safe. The virtual visit will also provide continuity of care. She verbalizes understanding.   Virtual Visit via telephone Note      I connected with the patient  on 04/27/18  by telephone.  I verified that I am speaking with the correct person using two identifiers.        I discussed the limitations of evaluation and management by telemedicine and the availability of in person appointments. The patient expressed understanding and agreed to proceed.  History of Present Illness:      This very nice 43 y.o. DWF presents for 6 month follow up.      Patient has been followed for ADD on Wellbutrin and 6 weeks ago was added methylphenidate 5 mg 2-3 x /day. On the Wellbutrin she reported improvement in mood, focus, concentration & productivity.  She also relates improved functioning in time management and focus /concebntration with work tasks with the  Addition of the Ritalin. She has not experienced the "wear-off" dysphoria that she experienced with the Adderall.      Also, the patient has history of elevated glucoses and has had no symptoms of reactive hypoglycemia, diabetic polys, paresthesias or visual blurring.  Last A1c was Normal & at goal. Lab Results  Component Value Date   HGBA1C 4.8 09/09/2017       Further, the patient also has history of Vitamin D Deficiency and supplements vitamin D without any suspected side-effects. Last vitamin D was at goal: Lab Results  Component Value Date   VD25OH 61 09/09/2017   Current Outpatient Medications on File Prior to Visit  Medication Sig  . buPROPion (WELLBUTRIN XL) 300 MG 24 hr tablet  Take 1 Tablet every morning for Focus & Concentration  . cetirizine (ZYRTEC) 10 MG tablet Take 10 mg by mouth daily.  . fluconazole (DIFLUCAN) 150 MG tablet Take 1 tablet daily for candidiasis  . fluconazole (DIFLUCAN) 150 MG tablet Take 1 tablet daily for oral Candidiasis  . Fluticasone-Umeclidin-Vilant (TRELEGY ELLIPTA) 100-62.5-25 MCG/INH AEPB Inhale 1 puff into the lungs daily.  . methylphenidate (RITALIN) 5 MG tablet Take 1 tablet 2 - 3 x /day for ADD  . Multiple Vitamins-Minerals (MULTIVITAMIN WITH MINERALS) tablet Take 1 tablet by mouth daily.  Marland Kitchen. nystatin (MYCOSTATIN) 100000 UNIT/ML suspension Swish & hold in mouth 1-2 minutes, then Swallow - repeat every 4 hours or 4 x  /day  . Omega-3 Fatty Acids (FISH OIL PO) Take 1 capsule by mouth daily.  . ondansetron (ZOFRAN ODT) 8 MG disintegrating tablet Dissolve 1/2 to 1 tablet 2 to 3 x /day for nausea / vomitting  . PROAIR HFA 108 (90 Base) MCG/ACT inhaler 1 to 2 inhalations 10-15 minutes apart every 4 hours as needed to rescue Asthma  . Thyroid 48.75 MG TABS Take 1 tablet by mouth 2 (two) times daily.  . traMADol (ULTRAM) 50 MG tablet Take 1/2 to 1 tablet every 4 hours if needed for Pain   No current facility-administered medications on file prior to visit.    Allergies  Allergen Reactions  . Penicillins     REACTION: hives  PMHx:   Past Medical History:  Diagnosis Date  . Allergy   . Asthma   . Thyroid disease    FHx:    Reviewed / unchanged  SHx:    Reviewed / unchanged   Systems Review:  10 point systems review negative except as above. Neuro: No weakness, tremor, incoordination, spasms, paresthesia or pain. Psychiatric: Denies confusion, memory loss or sensory loss.   Physical Exam General : Well sounding patient in no apparent distress HEENT: no hoarseness, no cough for duration of visit Lungs: speaks in complete sentences, no audible wheezing, no apparent distress Neurological: alert, oriented x 3 Psychiatric:  pleasant, judgement appropriate   Assessment and Plan:  1. Attention deficit disorder (ADD) without hyperactivity  - Reviewed meds and discussed effects & side effects       Discussed  regular exercise, BP monitoring.  I discussed the assessment and treatment plan with the patient. The patient was provided an opportunity to ask questions and all were answered. The patient agreed with the plan and demonstrated an understanding of the instructions.           The patient was advised to call back or seek an in-person evaluation if the symptoms worsen or if the condition fails to improve as anticipated.       I provided 25 minutes of non-face-to-face time during this encounter and over 30 minutes of counseling, chart review and  critical decision making was performed  Marinus Maw, MD

## 2018-04-28 ENCOUNTER — Other Ambulatory Visit: Payer: Self-pay

## 2018-04-28 ENCOUNTER — Ambulatory Visit: Payer: BLUE CROSS/BLUE SHIELD | Admitting: Internal Medicine

## 2018-04-28 DIAGNOSIS — F988 Other specified behavioral and emotional disorders with onset usually occurring in childhood and adolescence: Secondary | ICD-10-CM | POA: Diagnosis not present

## 2018-04-30 ENCOUNTER — Encounter: Payer: Self-pay | Admitting: Internal Medicine

## 2018-05-16 MED FILL — NATURE-THROID 48.75 MG TAB: 48.75 | 30 days supply | Qty: 60 | Fill #1

## 2018-05-19 ENCOUNTER — Ambulatory Visit (INDEPENDENT_AMBULATORY_CARE_PROVIDER_SITE_OTHER): Payer: BLUE CROSS/BLUE SHIELD | Admitting: Psychology

## 2018-05-19 DIAGNOSIS — F411 Generalized anxiety disorder: Secondary | ICD-10-CM

## 2018-06-21 MED FILL — NATURE-THROID 48.75 MG TAB: 48.75 | 30 days supply | Qty: 60 | Fill #2

## 2018-07-25 MED FILL — NATURE-THROID 48.75 MG TAB: 48.75 | 30 days supply | Qty: 60 | Fill #0

## 2018-07-29 ENCOUNTER — Other Ambulatory Visit: Payer: Self-pay | Admitting: Internal Medicine

## 2018-07-29 DIAGNOSIS — F988 Other specified behavioral and emotional disorders with onset usually occurring in childhood and adolescence: Secondary | ICD-10-CM

## 2018-07-29 MED ORDER — METHYLPHENIDATE HCL 5 MG PO TABS: ORAL_TABLET | ORAL | 0 refills | Status: DC

## 2018-08-29 MED FILL — NATURE-THROID 48.75 MG TAB: 48.75 | 30 days supply | Qty: 60 | Fill #1

## 2018-09-22 MED FILL — NP THYROID 90 MG TABLET: 90 | 30 days supply | Qty: 30 | Fill #0

## 2018-10-03 MED FILL — NP THYROID 90 MG TABLET: 90 | 30 days supply | Qty: 30 | Fill #0

## 2018-10-07 ENCOUNTER — Other Ambulatory Visit: Payer: Self-pay | Admitting: Internal Medicine

## 2018-10-07 DIAGNOSIS — F988 Other specified behavioral and emotional disorders with onset usually occurring in childhood and adolescence: Secondary | ICD-10-CM

## 2018-10-21 ENCOUNTER — Other Ambulatory Visit: Payer: Self-pay | Admitting: Internal Medicine

## 2018-10-21 MED ORDER — CIPROFLOXACIN HCL 250 MG PO TABS: ORAL_TABLET | ORAL | 0 refills | Status: DC

## 2018-10-30 ENCOUNTER — Encounter: Payer: Self-pay | Admitting: Internal Medicine

## 2018-10-30 NOTE — Patient Instructions (Signed)

## 2018-10-30 NOTE — Progress Notes (Signed)
History of Present Illness:      This very nice 43 y.o. DWF  presents for 6 month follow up BP screening, hx/o abn glucose, ADD and Vitamin D Deficiency. 9 days ago she was treated with 1 week course (completed)  of Cipro for suspected UTI.      Earlier in the year circa the time of onset of the Covid-19 pandemic, patient experienced a protracted upper & lower respiratory infection with negative w/u  and is concerned that she may have had a Covid-19 infection.       Patient has been followed for ADD on Wellbutrin . On the Wellbutrin she reported improvement in mood, focus, concentration & productivity. With  Addition of  Ritalin, she reported improved  focus / concebntration in time management and with work tasks.  More recently she saw Dr Vivia Ewing, ADD specialist and patient was prescribed Amphetamine 10 mg salt which patient takes only 1/4 tab with apparent good results w/o side-effects.       Today's BP is at goal - 106/64. Patient has had no complaints of any cardiac type chest pain, palpitations, dyspnea / orthopnea / PND, dizziness, claudication, or dependent edema.      Also, the patient has history of  abnormal glucose and has had no symptoms of reactive hypoglycemia, diabetic polys, paresthesias or visual blurring.  Last A1c was Normal & at goal:  Lab Results  Component Value Date   HGBA1C 4.8 09/09/2017       In 2011, patient was dx'd Hypothyroid and initiated on Thyroid extract by another provider.       Further, the patient also has history of Vitamin D Deficiency and supplements vitamin D without any suspected side-effects. Last vitamin D was at goal:  Lab Results  Component Value Date   VD25OH 61 09/09/2017   Current Outpatient Medications on File Prior to Visit  Medication Sig  . buPROPion (WELLBUTRIN XL) 300 MG 24 hr tablet TAKE 1 TABLET BY MOUTH EVERY MORNING FOR FOCUS AND CONCENTRATION  . cetirizine (ZYRTEC) 10 MG tablet Take 10 mg by mouth daily.  .  Fluticasone-Umeclidin-Vilant (TRELEGY ELLIPTA) 100-62.5-25 MCG/INH AEPB Inhale 1 puff into the lungs daily.  . Multiple Vitamins-Minerals (MULTIVITAMIN WITH MINERALS) tablet Take 1 tablet by mouth daily.  . Omega-3 Fatty Acids (FISH OIL PO) Take 1 capsule by mouth daily.  . Thyroid 48.75 MG TABS Take 1 tablet by mouth 2 (two) times daily.  . traMADol (ULTRAM) 50 MG tablet Take 1/2 to 1 tablet every 4 hours if needed for Pain  . PROAIR HFA 108 (90 Base) MCG/ACT inhaler 1 to 2 inhalations 10-15 minutes apart every 4 hours as needed to rescue Asthma   No current facility-administered medications on file prior to visit.    Allergies  Allergen Reactions  . Penicillins     REACTION: hives   PMHx:   Past Medical History:  Diagnosis Date  . Allergy   . Asthma   . Thyroid disease    History reviewed. No pertinent surgical history.  FHx:    Reviewed / unchanged  SHx:    Reviewed / unchanged   Systems Review:  Constitutional: Denies fever, chills, wt changes, headaches, insomnia, fatigue, night sweats, change in appetite. Eyes: Denies redness, blurred vision, diplopia, discharge, itchy, watery eyes.  ENT: Denies discharge, congestion, post nasal drip, epistaxis, sore throat, earache, hearing loss, dental pain, tinnitus, vertigo, sinus pain, snoring.  CV: Denies chest pain, palpitations, irregular heartbeat,  syncope, dyspnea, diaphoresis, orthopnea, PND, claudication or edema. Respiratory: denies cough, dyspnea, DOE, pleurisy, hoarseness, laryngitis, wheezing.  Gastrointestinal: Denies dysphagia, odynophagia, heartburn, reflux, water brash, abdominal pain or cramps, nausea, vomiting, bloating, diarrhea, constipation, hematemesis, melena, hematochezia  or hemorrhoids. Genitourinary: Denies dysuria, frequency, urgency, nocturia, hesitancy, discharge, hematuria or flank pain. Musculoskeletal: Denies arthralgias, myalgias, stiffness, jt. swelling, pain, limping or strain/sprain.  Skin: Denies  pruritus, rash, hives, warts, acne, eczema or change in skin lesion(s). Neuro: No weakness, tremor, incoordination, spasms, paresthesia or pain. Psychiatric: Denies confusion, memory loss or sensory loss. Endo: Denies change in weight, skin or hair change.  Heme/Lymph: No excessive bleeding, bruising or enlarged lymph nodes.  Physical Exam  BP 106/64   Pulse 88   Temp (!) 97.4 F (36.3 C)   Resp 16   Ht 5' 3.5" (1.613 m)   Wt 113 lb 3.2 oz (51.3 kg)   BMI 19.74 kg/m   Appears  well nourished, well groomed  and in no distress.  Eyes: PERRLA, EOMs, conjunctiva no swelling or erythema. Sinuses: No frontal/maxillary tenderness ENT/Mouth: EAC's clear, TM's nl w/o erythema, bulging. Nares clear w/o erythema, swelling, exudates. Oropharynx clear without erythema or exudates. Oral hygiene is good. Tongue normal, non obstructing. Hearing intact.  Neck: Supple. Thyroid not palpable. Car 2+/2+ without bruits, nodes or JVD. Chest: Respirations nl with BS clear & equal w/o rales, rhonchi, wheezing or stridor.  Cor: Heart sounds normal w/ regular rate and rhythm without sig. murmurs, gallops, clicks or rubs. Peripheral pulses normal and equal  without edema.  Abdomen: Soft & bowel sounds normal. Non-tender w/o guarding, rebound, hernias, masses or organomegaly.  Lymphatics: Unremarkable.  Musculoskeletal: Full ROM all peripheral extremities, joint stability, 5/5 strength and normal gait.  Skin: Warm, dry without exposed rashes, lesions or ecchymosis apparent.  Neuro: Cranial nerves intact, reflexes equal bilaterally. Sensory-motor testing grossly intact. Tendon reflexes grossly intact.  Pysch: Alert & oriented x 3.  Insight and judgement nl & appropriate. No ideations.  Assessment and Plan:  1. Attention deficit disorder (ADD) without hyperactivity  - Amphetamine Sulfate 10 MG TABS; Take 1 tablet by mouth daily. Takes 1/4 tablet Daily  Dispense: 30 tablet; Refill: 0  2. Abnormal glucose  -  Hemoglobin A1c  3. Hypothyroidism  - TSH  4. Vitamin D deficiency  - VITAMIN D 25 Hydroxyl  5. Exposure to COVID-19 virus  - SAR CoV2 Serology (COVID 19)AB(IGG)IA  6. Iron deficiency anemia, unspecified iron deficiency  - CBC with Differential/Platelet - COMPLETE METABOLIC PANEL WITH GFR - Iron,Total/Total Iron Binding Cap - Ferritin  7. Dysuria  - Urinalysis, Routine w reflex microscopic - Urine Culture  8. Medication management  - CBC with Differential/Platelet - COMPLETE METABOLIC PANEL WITH GFR - TSH - Hemoglobin A1c - VITAMIN D 25 Hydroxl - Iron,Total/Total Iron Binding Cap - Ferritin       Discussed  regular exercise, BP monitoring, weight control to achieve/maintain BMI less than 25 and discussed med and SE's. Recommended labs to assess and monitor clinical status with further disposition pending results of labs.  I discussed the assessment and treatment plan with the patient. The patient was provided an opportunity to ask questions and all were answered. The patient agreed with the plan and demonstrated an understanding of the instructions.  I provided over 30 minutes of exam, counseling, chart review and  complex critical decision making.  Marinus Maw, MD

## 2018-10-31 ENCOUNTER — Encounter: Payer: Self-pay | Admitting: Internal Medicine

## 2018-10-31 ENCOUNTER — Ambulatory Visit (INDEPENDENT_AMBULATORY_CARE_PROVIDER_SITE_OTHER): Payer: BLUE CROSS/BLUE SHIELD | Admitting: Internal Medicine

## 2018-10-31 ENCOUNTER — Other Ambulatory Visit: Payer: Self-pay | Admitting: Internal Medicine

## 2018-10-31 ENCOUNTER — Other Ambulatory Visit: Payer: Self-pay

## 2018-10-31 VITALS — BP 106/64 | HR 88 | Temp 97.4°F | Resp 16 | Ht 63.5 in | Wt 113.2 lb

## 2018-10-31 DIAGNOSIS — E039 Hypothyroidism, unspecified: Secondary | ICD-10-CM

## 2018-10-31 DIAGNOSIS — R300 Dysuria: Secondary | ICD-10-CM

## 2018-10-31 DIAGNOSIS — D509 Iron deficiency anemia, unspecified: Secondary | ICD-10-CM

## 2018-10-31 DIAGNOSIS — Z20828 Contact with and (suspected) exposure to other viral communicable diseases: Secondary | ICD-10-CM

## 2018-10-31 DIAGNOSIS — Z20822 Contact with and (suspected) exposure to covid-19: Secondary | ICD-10-CM

## 2018-10-31 DIAGNOSIS — Z79899 Other long term (current) drug therapy: Secondary | ICD-10-CM

## 2018-10-31 DIAGNOSIS — F988 Other specified behavioral and emotional disorders with onset usually occurring in childhood and adolescence: Secondary | ICD-10-CM

## 2018-10-31 DIAGNOSIS — E559 Vitamin D deficiency, unspecified: Secondary | ICD-10-CM | POA: Diagnosis not present

## 2018-10-31 DIAGNOSIS — R7309 Other abnormal glucose: Secondary | ICD-10-CM | POA: Diagnosis not present

## 2018-10-31 MED ORDER — MONTELUKAST SODIUM 10 MG PO TABS: ORAL_TABLET | ORAL | 3 refills | Status: DC

## 2018-10-31 MED ORDER — AMPHETAMINE SULFATE 10 MG PO TABS: 1.0000 | ORAL_TABLET | Freq: Every day | ORAL | 0 refills | Status: DC

## 2018-11-01 LAB — SAR COV2 SEROLOGY (COVID19)AB(IGG),IA: SARS CoV2 AB IGG: NEGATIVE

## 2018-11-02 LAB — CBC WITH DIFFERENTIAL/PLATELET
Absolute Monocytes: 599 cells/uL (ref 200–950)
Basophils Absolute: 32 cells/uL (ref 0–200)
Basophils Relative: 0.3 %
Eosinophils Absolute: 53 cells/uL (ref 15–500)
Eosinophils Relative: 0.5 %
HCT: 40.5 % (ref 35.0–45.0)
Hemoglobin: 13 g/dL (ref 11.7–15.5)
Lymphs Abs: 1470 cells/uL (ref 850–3900)
MCH: 26.2 pg — ABNORMAL LOW (ref 27.0–33.0)
MCHC: 32.1 g/dL (ref 32.0–36.0)
MCV: 81.7 fL (ref 80.0–100.0)
MPV: 12.1 fL (ref 7.5–12.5)
Monocytes Relative: 5.7 %
Neutro Abs: 8348 cells/uL — ABNORMAL HIGH (ref 1500–7800)
Neutrophils Relative %: 79.5 %
Platelets: 279 10*3/uL (ref 140–400)
RBC: 4.96 10*6/uL (ref 3.80–5.10)
RDW: 13 % (ref 11.0–15.0)
Total Lymphocyte: 14 %
WBC: 10.5 10*3/uL (ref 3.8–10.8)

## 2018-11-02 LAB — COMPLETE METABOLIC PANEL WITH GFR
AG Ratio: 1.9 (calc) (ref 1.0–2.5)
ALT: 14 U/L (ref 6–29)
AST: 15 U/L (ref 10–30)
Albumin: 4.7 g/dL (ref 3.6–5.1)
Alkaline phosphatase (APISO): 39 U/L (ref 31–125)
BUN: 15 mg/dL (ref 7–25)
CO2: 26 mmol/L (ref 20–32)
Calcium: 9.7 mg/dL (ref 8.6–10.2)
Chloride: 104 mmol/L (ref 98–110)
Creat: 0.86 mg/dL (ref 0.50–1.10)
GFR, Est African American: 97 mL/min/{1.73_m2} (ref >=60)
GFR, Est Non African American: 84 mL/min/{1.73_m2} (ref >=60)
Globulin: 2.5 g/dL (calc) (ref 1.9–3.7)
Glucose, Bld: 87 mg/dL (ref 65–99)
Potassium: 3.9 mmol/L (ref 3.5–5.3)
Sodium: 138 mmol/L (ref 135–146)
Total Bilirubin: 0.5 mg/dL (ref 0.2–1.2)
Total Protein: 7.2 g/dL (ref 6.1–8.1)

## 2018-11-02 LAB — URINALYSIS, ROUTINE W REFLEX MICROSCOPIC
Bacteria, UA: NONE SEEN /HPF
Bilirubin Urine: NEGATIVE
Glucose, UA: NEGATIVE
Hgb urine dipstick: NEGATIVE
Hyaline Cast: NONE SEEN /LPF
Ketones, ur: NEGATIVE
Nitrite: NEGATIVE
Protein, ur: NEGATIVE
RBC / HPF: NONE SEEN /HPF (ref 0–2)
Specific Gravity, Urine: 1.012 (ref 1.001–1.03)
WBC, UA: NONE SEEN /HPF (ref 0–5)
pH: 6.5 (ref 5.0–8.0)

## 2018-11-02 LAB — FERRITIN: Ferritin: 21 ng/mL (ref 16–232)

## 2018-11-02 LAB — HEMOGLOBIN A1C
Hgb A1c MFr Bld: 4.8 % of total Hgb (ref <5.7)
Mean Plasma Glucose: 91 (calc)
eAG (mmol/L): 5 (calc)

## 2018-11-02 LAB — URINE CULTURE
MICRO NUMBER:: 1004891
SPECIMEN QUALITY:: ADEQUATE

## 2018-11-02 LAB — IRON, TOTAL/TOTAL IRON BINDING CAP
%SAT: 35 % (calc) (ref 16–45)
Iron: 114 ug/dL (ref 40–190)
TIBC: 329 mcg/dL (calc) (ref 250–450)

## 2018-11-02 LAB — VITAMIN D 25 HYDROXY (VIT D DEFICIENCY, FRACTURES): Vit D, 25-Hydroxy: 84 ng/mL (ref 30–100)

## 2018-11-02 LAB — TSH: TSH: 2.06 mIU/L

## 2018-11-03 ENCOUNTER — Encounter: Payer: Self-pay | Admitting: *Deleted

## 2018-11-03 MED FILL — NP THYROID 90 MG TABLET: 90 | 30 days supply | Qty: 30 | Fill #1

## 2018-12-02 MED FILL — NP THYROID 90 MG TABLET: 90 | 30 days supply | Qty: 30 | Fill #2

## 2019-01-05 MED FILL — NP THYROID 90 MG TABLET: 90 | 30 days supply | Qty: 30 | Fill #3

## 2019-02-03 ENCOUNTER — Other Ambulatory Visit: Payer: Self-pay | Admitting: Internal Medicine

## 2019-02-03 DIAGNOSIS — N200 Calculus of kidney: Secondary | ICD-10-CM

## 2019-02-03 MED ORDER — ONDANSETRON 8 MG PO TBDP: ORAL_TABLET | ORAL | 0 refills | Status: DC

## 2019-02-03 MED ORDER — TAMSULOSIN HCL 0.4 MG PO CAPS: ORAL_CAPSULE | ORAL | 0 refills | Status: DC

## 2019-02-03 MED ORDER — OXYCODONE-ACETAMINOPHEN 7.5-325 MG PO TABS: ORAL_TABLET | ORAL | 0 refills | Status: DC

## 2019-02-09 ENCOUNTER — Other Ambulatory Visit: Payer: Self-pay | Admitting: Internal Medicine

## 2019-02-09 DIAGNOSIS — F988 Other specified behavioral and emotional disorders with onset usually occurring in childhood and adolescence: Secondary | ICD-10-CM

## 2019-02-09 DIAGNOSIS — J4520 Mild intermittent asthma, uncomplicated: Secondary | ICD-10-CM

## 2019-02-09 MED ORDER — TRELEGY ELLIPTA 100-62.5-25 MCG/INH IN AEPB: 1.0000 | INHALATION_SPRAY | Freq: Every day | RESPIRATORY_TRACT | 3 refills | Status: DC

## 2019-02-09 MED ORDER — BUPROPION HCL ER (XL) 300 MG PO TB24: ORAL_TABLET | ORAL | 3 refills | Status: DC

## 2019-02-09 MED ORDER — MONTELUKAST SODIUM 10 MG PO TABS: ORAL_TABLET | ORAL | 3 refills | Status: DC

## 2019-02-10 MED FILL — NP THYROID 90 MG TABLET: 90 | 30 days supply | Qty: 30 | Fill #0

## 2019-05-14 NOTE — Progress Notes (Addendum)
RESCHEDULED

## 2019-05-15 ENCOUNTER — Ambulatory Visit: Payer: BLUE CROSS/BLUE SHIELD | Admitting: Internal Medicine

## 2019-05-15 DIAGNOSIS — R030 Elevated blood-pressure reading, without diagnosis of hypertension: Secondary | ICD-10-CM

## 2019-05-15 DIAGNOSIS — R7309 Other abnormal glucose: Secondary | ICD-10-CM

## 2019-05-15 DIAGNOSIS — Z79899 Other long term (current) drug therapy: Secondary | ICD-10-CM

## 2019-05-15 DIAGNOSIS — E559 Vitamin D deficiency, unspecified: Secondary | ICD-10-CM

## 2019-05-15 DIAGNOSIS — E039 Hypothyroidism, unspecified: Secondary | ICD-10-CM

## 2019-05-15 DIAGNOSIS — F988 Other specified behavioral and emotional disorders with onset usually occurring in childhood and adolescence: Secondary | ICD-10-CM

## 2019-06-16 ENCOUNTER — Ambulatory Visit: Payer: Self-pay | Admitting: Internal Medicine

## 2019-10-05 ENCOUNTER — Other Ambulatory Visit: Payer: Self-pay | Admitting: Physician Assistant

## 2019-10-05 DIAGNOSIS — F988 Other specified behavioral and emotional disorders with onset usually occurring in childhood and adolescence: Secondary | ICD-10-CM

## 2019-10-25 NOTE — Progress Notes (Signed)
Assessment and Plan:  Kalyn was seen today for acute visit.  Diagnoses and all orders for this visit:  Tendonitis of elbow, left -     meloxicam (MOBIC) 15 MG tablet; Take 0.5 tablets (7.5 mg total) by mouth daily for two weeks then PRN. Ice area 4-5 times a day as needed. Continue to monitor area  Elevated BP without diagnosis of hypertension Controlled today  Contact office with any new or worsening symptoms.   Further disposition pending results of labs. Discussed med's effects and SE's.   Over 30 minutes of face to face exam, counseling, chart review, and critical decision making was performed.   No future appointments.  ------------------------------------------------------------------------------------------------------------------   HPI 44 y.o.female presents for evaluation of bum on left elbow inner forearm.  She reports she was working at the computer and felt like her shirt was tight.  It is achy and sore.  She first noted this Tues, two days ago.The area is red and warm at times. Sore and achy and is intermittent.  She reports in the morning she does not notice it and it does not wake her up at night.  As the day goes on it gets worse.  She reports it feels slightly less painful today.  She did some ice which seemed to help.  She took 400mg  of ibuprofen for her back, which she reports may have helped some.   She did help move a large couch over the weekend on Sunday.  She is physically fit and works as a Monday.  Reports she has not had anything like this in the past. Denies any fevers, numbness or tingling in lower extremity, trauma shortness of breath or chest pains.     Past Medical History:  Diagnosis Date  . Allergy   . Asthma   . Thyroid disease      Allergies  Allergen Reactions  . Penicillins     REACTION: hives    Current Outpatient Medications on File Prior to Visit  Medication Sig  . Amphetamine Sulfate 10 MG TABS  Take 1 tablet by mouth daily. Takes 1/4 tablet Daily  . buPROPion (WELLBUTRIN XL) 300 MG 24 hr tablet Take    1 tablet    Daily    for Mood, Focus & Concentration  . cetirizine (ZYRTEC) 10 MG tablet Take 10 mg by mouth daily.  . Fluticasone-Umeclidin-Vilant (TRELEGY ELLIPTA) 100-62.5-25 MCG/INH AEPB Inhale 1 puff into the lungs daily.  . montelukast (SINGULAIR) 10 MG tablet Take 1 tablet daily for Allergies & Asthma  . Multiple Vitamins-Minerals (MULTIVITAMIN WITH MINERALS) tablet Take 1 tablet by mouth daily.  . Omega-3 Fatty Acids (FISH OIL PO) Take 1 capsule by mouth daily.  . ondansetron (ZOFRAN ODT) 8 MG disintegrating tablet Dissolve 1 tablet under tongue every 6 hours for nausea  or vomitting  . Thyroid 48.75 MG TABS Take 1 tablet by mouth 2 (two) times daily.  . traMADol (ULTRAM) 50 MG tablet Take 1/2 to 1 tablet every 4 hours if needed for Pain  . PROAIR HFA 108 (90 Base) MCG/ACT inhaler 1 to 2 inhalations 10-15 minutes apart every 4 hours as needed to rescue Asthma   No current facility-administered medications on file prior to visit.    ROS: all negative except above.   Physical Exam:  BP 118/72   Pulse 66   Temp (!) 97.3 F (36.3 C)   Wt 112 lb (50.8 kg)   SpO2 98%   BMI 19.53  kg/m   General Appearance: Well nourished, in no apparent distress. Eyes: PERRLA, EOMs, conjunctiva no swelling or erythema Sinuses: No Frontal/maxillary tenderness ENT/Mouth: Ext aud canals clear, TMs without erythema, bulging. No erythema, swelling, or exudate on post pharynx.  Tonsils not swollen or erythematous. Hearing normal.  Neck: Supple, thyroid normal.  Respiratory: Respiratory effort normal, BS equal bilaterally without rales, rhonchi, wheezing or stridor.  Cardio: RRR with no MRGs. Brisk peripheral pulses without edema.  Abdomen: Soft, + BS.  Non tender, no guarding, rebound, hernias, masses. Lymphatics: Non tender without lymphadenopathy.  Musculoskeletal: Full ROM, 5/5 strength,  normal gait. Left posterior forearm firm fluctuant, tender to palpation and with rotation, pronation. No open area, exudate or streaking noted.  Skin: Warm, dry without rashes, lesions, ecchymosis.  Neuro: Cranial nerves intact. Normal muscle tone, no cerebellar symptoms. Sensation intact.  Psych: Awake and oriented X 3, normal affect, Insight and Judgment appropriate.     Elder Negus, NP 11:37 AM [facility]

## 2019-10-26 ENCOUNTER — Encounter: Payer: Self-pay | Admitting: Adult Health Nurse Practitioner

## 2019-10-26 ENCOUNTER — Other Ambulatory Visit: Payer: Self-pay

## 2019-10-26 ENCOUNTER — Ambulatory Visit (INDEPENDENT_AMBULATORY_CARE_PROVIDER_SITE_OTHER): Payer: 59 | Admitting: Adult Health Nurse Practitioner

## 2019-10-26 VITALS — BP 118/72 | HR 66 | Temp 97.3°F | Wt 112.0 lb

## 2019-10-26 DIAGNOSIS — M778 Other enthesopathies, not elsewhere classified: Secondary | ICD-10-CM

## 2019-10-26 DIAGNOSIS — R030 Elevated blood-pressure reading, without diagnosis of hypertension: Secondary | ICD-10-CM | POA: Diagnosis not present

## 2019-10-26 MED ORDER — MELOXICAM 15 MG PO TABS: 7.5000 mg | ORAL_TABLET | Freq: Every day | ORAL | 1 refills | Status: DC

## 2019-10-26 NOTE — Patient Instructions (Signed)
° °  We will send in Meloxicam for you to take once a day for two weeks.  Ice the area 2-3 times a day when you are home.   Monitor the area for any increasing redness, fevers, numbness tingling in lower hand, or pain that is not relieved by the meloxicam.  Let us know if you have any new or worsening symptoms.

## 2019-11-09 ENCOUNTER — Other Ambulatory Visit: Payer: Self-pay | Admitting: Internal Medicine

## 2020-04-30 ENCOUNTER — Ambulatory Visit: Payer: 59 | Admitting: Internal Medicine

## 2020-05-20 ENCOUNTER — Encounter: Payer: Self-pay | Admitting: Internal Medicine

## 2020-05-20 NOTE — Patient Instructions (Signed)

## 2020-05-20 NOTE — Progress Notes (Signed)
[facility] Appointments  Date Time Provider Department Center  05/21/2020 11:30 AM Lucky Cowboy, MD [facility] None    History of Present Illness:       This very nice 45 y.o. DWF presents for  follow up for BP Screening,  Hx/o abn glucose, ADD & Vitamin D Deficiency, Pre-Diabetes and Vitamin D Deficiency.        Patient is follow expectantly for BP screening  & BP has been controlled at home. Today's BP is at goal - 92/68. Patient has had no complaints of any cardiac type chest pain, palpitations, dyspnea Pollyann Kennedy /PND, dizziness, claudication, or dependent edema.       Patient has been  on Wellbutrin in the past for ADD. On the Wellbutrin she reported improvement in mood, focus, concentration &productivity. With Addition of  Ritalin, she reported improved  focus / concentration in time management and with work tasks.  last year, she saw  she saw Dr Denton Lank, ADD specialist and patient was prescribed Amphetamine 10 mg salt which patient takes only 1/4 tab with apparent good results w/o side-effects.    Also, the patient has history of abnormal glucose and has had no symptoms of reactive hypoglycemia, diabetic polys, paresthesias or visual blurring.  Last A1c was normal & at goal:  Component Value Date   HGBA1C 4.8 10/31/2018          Further, the patient also has history of Vitamin D Deficiency ("45" /Apr 2019) and supplements vitamin D without any suspected side-effects. Last vitamin D was at goal:  Lab Results  Component Value Date   VD25OH 84 10/31/2018    Current Outpatient Medications on File Prior to Visit  Medication Sig  . Amphetamine 10 MG TABS Take 1 tablet  AM & 1/4 tablet PM  . buPROPion XL 300 MG  Take    1 tablet    Daily   . montelukast  10 MG tablet Take     1 tablet     Daily     for  Allergies  . MultiVit-Minerals  Take 1 tablet  daily.  . Omega-3 FISH OIL  Take 1 capsule daily.  . Thyroid 48.75 MG TABS Take 1 tablet  2  times daily.  .  TRELEGY ELLIPTA 100-62.5-25  Inhale 1 puff  daily.  Marland Kitchen PROAIR HFA  inhaler 1 to 2 inhalations 10-15 minutes apart every 4 hours as needed to rescue Asthma    Allergies  Allergen Reactions  . Penicillins     REACTION: hives    PMHx:   Past Medical History:  Diagnosis Date  . Allergy   . Asthma   . Thyroid disease     History reviewed. No pertinent surgical history.  FHx:    Reviewed / unchanged  SHx:    Reviewed / unchanged   Systems Review:  Constitutional: Denies fever, chills, wt changes, headaches, insomnia, fatigue, night sweats, change in appetite. Eyes: Denies redness, blurred vision, diplopia, discharge, itchy, watery eyes.  ENT: Denies discharge, congestion, post nasal drip, epistaxis, sore throat, earache, hearing loss, dental pain, tinnitus, vertigo, sinus pain, snoring.  CV: Denies chest pain, palpitations, irregular heartbeat, syncope, dyspnea, diaphoresis, orthopnea, PND, claudication or edema. Respiratory: denies cough, dyspnea, DOE, pleurisy, hoarseness, laryngitis, wheezing.  Gastrointestinal: Denies dysphagia, odynophagia, heartburn, reflux, water brash, abdominal pain or cramps, nausea, vomiting, bloating, diarrhea, constipation, hematemesis, melena, hematochezia  or hemorrhoids. Genitourinary: Denies dysuria, frequency, urgency, nocturia, hesitancy, discharge, hematuria or flank pain. Musculoskeletal:  Denies arthralgias, myalgias, stiffness, jt. swelling, pain, limping or strain/sprain.  Skin: Denies pruritus, rash, hives, warts, acne, eczema or change in skin lesion(s). Neuro: No weakness, tremor, incoordination, spasms, paresthesia or pain. Psychiatric: Denies confusion, memory loss or sensory loss. Endo: Denies change in weight, skin or hair change.  Heme/Lymph: No excessive bleeding, bruising or enlarged lymph nodes.  Physical Exam  BP 92/68   P76   T 97.9 F    R 16   Ht 5' 3.5"    Wt 112 lb    SpO2 99%   BMI 19.56   Appears  well nourished,  well groomed  and in no distress.  Eyes: PERRLA, EOMs, conjunctiva no swelling or erythema. Sinuses: No frontal/maxillary tenderness ENT/Mouth: EAC's clear, TM's nl w/o erythema, bulging. Nares clear w/o erythema, swelling, exudates. Oropharynx clear without erythema or exudates. Oral hygiene is good. Tongue normal, non obstructing. Hearing intact.  Neck: Supple. Thyroid not palpable. Car 2+/2+ without bruits, nodes or JVD. Chest: Respirations nl with BS clear & equal w/o rales, rhonchi, wheezing or stridor.  Cor: Heart sounds normal w/ regular rate and rhythm without sig. murmurs, gallops, clicks or rubs. Peripheral pulses normal and equal  without edema.  Abdomen: Soft & bowel sounds normal. Non-tender w/o guarding, rebound, hernias, masses or organomegaly.  Lymphatics: Unremarkable.  Musculoskeletal: Full ROM all peripheral extremities, joint stability, 5/5 strength and normal gait.  Skin: Warm, dry without exposed rashes, lesions or ecchymosis apparent.  Neuro: Cranial nerves intact, reflexes equal bilaterally. Sensory-motor testing grossly intact. Tendon reflexes grossly intact.  Pysch: Alert & oriented x 3.  Insight and judgement nl & appropriate. No ideations.  Assessment and Plan:  1. Elevated BP without diagnosis of hypertension  - Continue monitor blood pressures - Continue DASH diet.  Reminder to go to the ER if any CP,  SOB, nausea, dizziness, severe HA, changes vision/speech.  - CBC with Differential/Platelet - COMPLETE METABOLIC PANEL WITH GFR  2. Attention deficit disorder (ADD) without hyperactivity  - Continue diet/meds, exercise,& lifestyle modifications.  - Continue monitor periodic cholesterol/liver & renal functions   - COMPLETE METABOLIC PANEL WITH GFR  3. Abnormal glucose  - Continue diet, exercise  - Lifestyle modifications.  - Monitor appropriate labs  - COMPLETE METABOLIC PANEL WITH GFR  4. Vitamin D deficiency  - Continue supplementation.  -  VITAMIN D 25 Hydroxy  5. Medication management  - CBC with Differential/Platelet - COMPLETE METABOLIC PANEL WITH GFR - Lipid panel - TSH - VITAMIN D 25 Hydroxy        Discussed  regular exercise, BP monitoring, weight control to achieve/maintain BMI less than 25 and discussed med and SE's. Recommended labs to assess and monitor clinical status with further disposition pending results of labs.  I discussed the assessment and treatment plan with the patient. The patient was provided an opportunity to ask questions and all were answered. The patient agreed with the plan and demonstrated an understanding of the instructions.  I provided over of exam, counseling, chart review and  complex critical decision making.        The patient was advised to call back or seek an in-person evaluation if the symptoms worsen or if the condition fails to improve as anticipated.   Marinus Maw, MD

## 2020-05-21 ENCOUNTER — Other Ambulatory Visit: Payer: Self-pay

## 2020-05-21 ENCOUNTER — Ambulatory Visit (INDEPENDENT_AMBULATORY_CARE_PROVIDER_SITE_OTHER): Payer: 59 | Admitting: Internal Medicine

## 2020-05-21 VITALS — BP 92/68 | HR 76 | Temp 97.9°F | Resp 16 | Ht 63.5 in | Wt 112.2 lb

## 2020-05-21 DIAGNOSIS — R030 Elevated blood-pressure reading, without diagnosis of hypertension: Secondary | ICD-10-CM | POA: Diagnosis not present

## 2020-05-21 DIAGNOSIS — R7309 Other abnormal glucose: Secondary | ICD-10-CM | POA: Diagnosis not present

## 2020-05-21 DIAGNOSIS — F988 Other specified behavioral and emotional disorders with onset usually occurring in childhood and adolescence: Secondary | ICD-10-CM

## 2020-05-21 DIAGNOSIS — E559 Vitamin D deficiency, unspecified: Secondary | ICD-10-CM

## 2020-05-21 DIAGNOSIS — Z79899 Other long term (current) drug therapy: Secondary | ICD-10-CM | POA: Diagnosis not present

## 2020-05-22 LAB — CBC WITH DIFFERENTIAL/PLATELET
Absolute Monocytes: 764 cells/uL (ref 200–950)
Basophils Absolute: 28 cells/uL (ref 0–200)
Basophils Relative: 0.3 %
Eosinophils Absolute: 83 cells/uL (ref 15–500)
Eosinophils Relative: 0.9 %
HCT: 42.5 % (ref 35.0–45.0)
Hemoglobin: 13.3 g/dL (ref 11.7–15.5)
Lymphs Abs: 1785 cells/uL (ref 850–3900)
MCH: 24.8 pg — ABNORMAL LOW (ref 27.0–33.0)
MCHC: 31.3 g/dL — ABNORMAL LOW (ref 32.0–36.0)
MCV: 79.1 fL — ABNORMAL LOW (ref 80.0–100.0)
MPV: 12.4 fL (ref 7.5–12.5)
Monocytes Relative: 8.3 %
Neutro Abs: 6541 cells/uL (ref 1500–7800)
Neutrophils Relative %: 71.1 %
Platelets: 274 10*3/uL (ref 140–400)
RBC: 5.37 10*6/uL — ABNORMAL HIGH (ref 3.80–5.10)
RDW: 13.2 % (ref 11.0–15.0)
Total Lymphocyte: 19.4 %
WBC: 9.2 10*3/uL (ref 3.8–10.8)

## 2020-05-22 LAB — COMPLETE METABOLIC PANEL WITH GFR
AG Ratio: 1.7 (calc) (ref 1.0–2.5)
ALT: 15 U/L (ref 6–29)
AST: 16 U/L (ref 10–30)
Albumin: 4.7 g/dL (ref 3.6–5.1)
Alkaline phosphatase (APISO): 43 U/L (ref 31–125)
BUN: 20 mg/dL (ref 7–25)
CO2: 27 mmol/L (ref 20–32)
Calcium: 9.6 mg/dL (ref 8.6–10.2)
Chloride: 104 mmol/L (ref 98–110)
Creat: 0.94 mg/dL (ref 0.50–1.10)
GFR, Est African American: 86 mL/min/{1.73_m2} (ref >=60)
GFR, Est Non African American: 74 mL/min/{1.73_m2} (ref >=60)
Globulin: 2.7 g/dL (calc) (ref 1.9–3.7)
Glucose, Bld: 84 mg/dL (ref 65–99)
Potassium: 4.3 mmol/L (ref 3.5–5.3)
Sodium: 137 mmol/L (ref 135–146)
Total Bilirubin: 0.4 mg/dL (ref 0.2–1.2)
Total Protein: 7.4 g/dL (ref 6.1–8.1)

## 2020-05-22 LAB — TSH: TSH: 2.05 mIU/L

## 2020-05-22 LAB — LIPID PANEL
Cholesterol: 151 mg/dL (ref <200)
HDL: 64 mg/dL (ref >=50)
LDL Cholesterol (Calc): 70 mg/dL (calc)
Non-HDL Cholesterol (Calc): 87 mg/dL (calc) (ref <130)
Total CHOL/HDL Ratio: 2.4 (calc) (ref <5.0)
Triglycerides: 86 mg/dL (ref <150)

## 2020-05-22 LAB — VITAMIN D 25 HYDROXY (VIT D DEFICIENCY, FRACTURES): Vit D, 25-Hydroxy: 36 ng/mL (ref 30–100)

## 2020-10-04 ENCOUNTER — Other Ambulatory Visit: Payer: Self-pay

## 2020-10-04 DIAGNOSIS — F988 Other specified behavioral and emotional disorders with onset usually occurring in childhood and adolescence: Secondary | ICD-10-CM

## 2020-10-04 MED ORDER — BUPROPION HCL ER (XL) 300 MG PO TB24: ORAL_TABLET | ORAL | 3 refills | Status: DC

## 2021-01-29 ENCOUNTER — Ambulatory Visit: Payer: 59 | Admitting: Internal Medicine

## 2021-01-29 ENCOUNTER — Encounter: Payer: Self-pay | Admitting: Internal Medicine

## 2021-01-29 NOTE — Progress Notes (Addendum)
  C  A  N  C  E  L  L  E  D   A  T      A  P  P  T          T  I  M  E 

## 2021-02-22 ENCOUNTER — Other Ambulatory Visit: Payer: Self-pay | Admitting: Internal Medicine

## 2021-02-22 MED ORDER — DEXAMETHASONE 4 MG PO TABS: ORAL_TABLET | ORAL | 0 refills | Status: DC

## 2021-02-25 ENCOUNTER — Other Ambulatory Visit: Payer: Self-pay | Admitting: Internal Medicine

## 2021-02-25 DIAGNOSIS — N6332 Unspecified lump in axillary tail of the left breast: Secondary | ICD-10-CM

## 2021-02-25 NOTE — Progress Notes (Signed)
° °  Late Entry    Patient is a very nice 46 yo MWF who was seen Sunday, 02/23/21, brought in to office by her husband for emergency evaluation for concern of a painful sore lump in her Left lateral breast .   On exam she had a tender thickening in the Left axillary tail of [place] w/o an area of induration or fluctuance appreciated.  There was a question of tenderness in the anterior muscle margins of the Lt axilla.   Patient was recommended diagnostic MGM & U/S

## 2021-02-28 ENCOUNTER — Other Ambulatory Visit: Payer: Self-pay

## 2021-02-28 ENCOUNTER — Other Ambulatory Visit: Payer: Self-pay | Admitting: Internal Medicine

## 2021-02-28 ENCOUNTER — Other Ambulatory Visit: Payer: 59

## 2021-02-28 DIAGNOSIS — R5383 Other fatigue: Secondary | ICD-10-CM

## 2021-02-28 DIAGNOSIS — E039 Hypothyroidism, unspecified: Secondary | ICD-10-CM

## 2021-02-28 DIAGNOSIS — Z79899 Other long term (current) drug therapy: Secondary | ICD-10-CM

## 2021-02-28 DIAGNOSIS — R7309 Other abnormal glucose: Secondary | ICD-10-CM

## 2021-02-28 DIAGNOSIS — J4520 Mild intermittent asthma, uncomplicated: Secondary | ICD-10-CM

## 2021-02-28 DIAGNOSIS — E559 Vitamin D deficiency, unspecified: Secondary | ICD-10-CM

## 2021-02-28 DIAGNOSIS — R030 Elevated blood-pressure reading, without diagnosis of hypertension: Secondary | ICD-10-CM

## 2021-02-28 DIAGNOSIS — Z13220 Encounter for screening for lipoid disorders: Secondary | ICD-10-CM

## 2021-03-01 NOTE — Progress Notes (Signed)
=============================================================== °  Please out in letter format for Wed CPE =============================================================== ===============================================================  -  Iron & Vitamin B12 - Both Normal  <><><><><><><><><><><><><><><><><><><><><><><><><><><><><><><> - Total Chol = 144    &  LDL Chol = 69 - Both  Excellent   - Very low risk for Heart Attack  / Stroke <><><><><><><><><><><><><><><><><><><><><><><><><><><><><><><> - A1c 4.9% - No Diabetes  - Great ! <><><><><><><><><><><><><><><><><><><><><><><><><><><><><><><> - Vitamin D -  76 - Excellent - Please keep dose same  <><><><><><><><><><><><><><><><><><><><><><><><><><><><><><><> - All Else - CBC - Kidneys - Electrolytes - Liver - Magnesium & Thyroid   - all  Normal / OK =============================================================== ===============================================================  - Keep up the [place] Work !  =============================================================== ===============================================================

## 2021-03-03 LAB — URINALYSIS, ROUTINE W REFLEX MICROSCOPIC
Bilirubin Urine: NEGATIVE
Glucose, UA: NEGATIVE
Hgb urine dipstick: NEGATIVE
Ketones, ur: NEGATIVE
Leukocytes,Ua: NEGATIVE
Nitrite: NEGATIVE
Protein, ur: NEGATIVE
Specific Gravity, Urine: 1.012 (ref 1.001–1.035)
pH: 7 (ref 5.0–8.0)

## 2021-03-03 LAB — CBC WITH DIFFERENTIAL/PLATELET
Absolute Monocytes: 756 cells/uL (ref 200–950)
Basophils Absolute: 27 cells/uL (ref 0–200)
Basophils Relative: 0.3 %
Eosinophils Absolute: 36 cells/uL (ref 15–500)
Eosinophils Relative: 0.4 %
HCT: 39.9 % (ref 35.0–45.0)
Hemoglobin: 12.6 g/dL (ref 11.7–15.5)
Lymphs Abs: 1836 cells/uL (ref 850–3900)
MCH: 25.6 pg — ABNORMAL LOW (ref 27.0–33.0)
MCHC: 31.6 g/dL — ABNORMAL LOW (ref 32.0–36.0)
MCV: 81.1 fL (ref 80.0–100.0)
MPV: 12.4 fL (ref 7.5–12.5)
Monocytes Relative: 8.4 %
Neutro Abs: 6345 cells/uL (ref 1500–7800)
Neutrophils Relative %: 70.5 %
Platelets: 334 10*3/uL (ref 140–400)
RBC: 4.92 10*6/uL (ref 3.80–5.10)
RDW: 12.8 % (ref 11.0–15.0)
Total Lymphocyte: 20.4 %
WBC: 9 10*3/uL (ref 3.8–10.8)

## 2021-03-03 LAB — VITAMIN D 25 HYDROXY (VIT D DEFICIENCY, FRACTURES): Vit D, 25-Hydroxy: 76 ng/mL (ref 30–100)

## 2021-03-03 LAB — HEMOGLOBIN A1C
Hgb A1c MFr Bld: 4.9 % of total Hgb (ref <5.7)
Mean Plasma Glucose: 94 mg/dL
eAG (mmol/L): 5.2 mmol/L

## 2021-03-03 LAB — COMPLETE METABOLIC PANEL WITH GFR
AG Ratio: 2.1 (calc) (ref 1.0–2.5)
ALT: 20 U/L (ref 6–29)
AST: 15 U/L (ref 10–30)
Albumin: 4.6 g/dL (ref 3.6–5.1)
Alkaline phosphatase (APISO): 36 U/L (ref 31–125)
BUN: 17 mg/dL (ref 7–25)
CO2: 26 mmol/L (ref 20–32)
Calcium: 9.7 mg/dL (ref 8.6–10.2)
Chloride: 102 mmol/L (ref 98–110)
Creat: 0.91 mg/dL (ref 0.50–0.99)
Globulin: 2.2 g/dL (calc) (ref 1.9–3.7)
Glucose, Bld: 89 mg/dL (ref 65–99)
Potassium: 4.1 mmol/L (ref 3.5–5.3)
Sodium: 137 mmol/L (ref 135–146)
Total Bilirubin: 0.4 mg/dL (ref 0.2–1.2)
Total Protein: 6.8 g/dL (ref 6.1–8.1)
eGFR: 80 mL/min/{1.73_m2} (ref >=60)

## 2021-03-03 LAB — LIPID PANEL
Cholesterol: 144 mg/dL (ref <200)
HDL: 60 mg/dL (ref >=50)
LDL Cholesterol (Calc): 69 mg/dL (calc)
Non-HDL Cholesterol (Calc): 84 mg/dL (calc) (ref <130)
Total CHOL/HDL Ratio: 2.4 (calc) (ref <5.0)
Triglycerides: 72 mg/dL (ref <150)

## 2021-03-03 LAB — TSH: TSH: 2.18 mIU/L

## 2021-03-03 LAB — MICROALBUMIN / CREATININE URINE RATIO
Creatinine, Urine: 43 mg/dL (ref 20–275)
Microalb, Ur: <0.2 mg/dL

## 2021-03-03 LAB — IRON, TOTAL/TOTAL IRON BINDING CAP
%SAT: 34 % (calc) (ref 16–45)
Iron: 113 ug/dL (ref 40–190)
TIBC: 335 mcg/dL (calc) (ref 250–450)

## 2021-03-03 LAB — VITAMIN B12: Vitamin B-12: 618 pg/mL (ref 200–1100)

## 2021-03-03 LAB — MAGNESIUM: Magnesium: 2.1 mg/dL (ref 1.5–2.5)

## 2021-03-03 LAB — INSULIN, RANDOM: Insulin: 4.5 u[IU]/mL

## 2021-03-04 ENCOUNTER — Encounter: Payer: Self-pay | Admitting: Internal Medicine

## 2021-03-04 NOTE — Progress Notes (Addendum)
Annual Screening/Preventative Visit & Comprehensive Evaluation &  Examination  Future Appointments  Date Time Provider Department  03/05/2021 10:00 AM Unk Pinto, MD [facility]  03/24/2021  8:40 AM [facility] [place] 1 [facility]  03/24/2021  8:50 AM [facility] [place] 1 [facility]  03/10/2022 11:00 AM Unk Pinto, MD [facility]        This very nice 46 y.o.  MWF presents for a Screening /Preventative Visit & comprehensive evaluation and management of multiple medical co-morbidities.  Patient has been followed for  BP Screening,  Hx/o abn glucose, ADD & Vitamin D Deficiency, Pre-Diabetes and Vitamin D Deficiency.        Patient was recently evaluated for concerns of a sore lump in her left lateral breast in the axillary tail of [place]  area  & is scheduled for breast U/S  & diagnostic MGM.       Patient has been prescribed thyroid extract by another provider.        Patient has hx/o ADD treated in the past with Bupropion. Treatment in the past with Ritalin improved focus, concentration in time management with work tasks. More recently she had been seen by Dr Vivia Ewing and was treated  with Amphetamine 10 mg salt and taking only 1/4 tablet , she had favorable response.         Patient is followed expectantly for elevated BP  & BP 's have been controlled. Patient denies any cardiac symptoms as chest pain, palpitations, shortness of breath, dizziness or ankle swelling. Today's BP is at goal at 102/58.        Patient's lipids  are controlled with diet. Last lipids were at goal :  Lab Results  Component Value Date   CHOL 144 02/28/2021   HDL 60 02/28/2021   LDLCALC 69 02/28/2021   TRIG 72 02/28/2021   CHOLHDL 2.4 02/28/2021         Patient is monitored for glucose intolerance  and patient denies reactive hypoglycemic symptoms, visual blurring, diabetic polys or paresthesias. Recent  A1c was at goal :  Lab Results  Component Value Date   HGBA1C 4.9 02/28/2021          Finally, patient has history of Vitamin D Deficiency ("36" /May 2022) and recent Vitamin D was at goal ( betw 70-100 ) :  Lab Results  Component Value Date   VD25OH 76 02/28/2021     Current Outpatient Medications on File Prior to Visit  Medication Sig   Amphetamine Sulfate 10 MG TABS Take 1 tablet  daily. Takes 1/4 tablet Daily   buPROPion XL 300 MG  Take 1 tablet  Daily for Mood, Focus & Concentration   TRELEGY ELLIPTA 100-62.5-25 A Inhale 1 puff  daily.   montelukast  10 MG tablet Take     1 tablet     Daily     for  Allergies   Multiple Vitamins-Minerals  Take 1 tablet daily.   Omega-3 FISH OIL  Take 1 capsule daily.   PROAIR HFA  inhaler 1 to 2 inhalations 1 every 4 hours as needed    Thyroid 48.75 MG TABS Take 1 tablet  2 times daily.     Allergies  Allergen Reactions   Penicillins REACTION: hives     Past Medical History:  Diagnosis Date   Allergy    Asthma    Thyroid disease      Health Maintenance  Topic Date Due   COVID-19 Vaccine (1) Never done   HIV Screening  Never done   Hepatitis C Screening  Never done   TETANUS/TDAP  Never done   PAP SMEAR-Modifier  Never done   INFLUENZA VACCINE  Never done   HPV VACCINES  Aged Out     Last MGM - Diagnostic & U/S pending 03/24/2021    No past surgical history on file.   Family History  Problem Relation Age of Onset   Hypertension Mother    Cancer , prostate Father           Autism Brother      Social History   Tobacco Use   Smoking status: Never   Smokeless tobacco: Never  Substance Use Topics   Alcohol use: Yes   Drug use: No      ROS Constitutional: Denies fever, chills, weight loss/gain, headaches, insomnia,  night sweats, and change in appetite. Does c/o fatigue. Eyes: Denies redness, blurred vision, diplopia, discharge, itchy, watery eyes.  ENT: Denies discharge, congestion, post nasal drip, epistaxis, sore throat, earache, hearing loss, dental pain, Tinnitus, Vertigo, Sinus  pain, snoring.  Cardio: Denies chest pain, palpitations, irregular heartbeat, syncope, dyspnea, diaphoresis, orthopnea, PND, claudication, edema Respiratory: denies cough, dyspnea, DOE, pleurisy, hoarseness, laryngitis, wheezing.  Gastrointestinal: Denies dysphagia, heartburn, reflux, water brash, pain, cramps, nausea, vomiting, bloating, diarrhea, constipation, hematemesis, melena, hematochezia, jaundice, hemorrhoids Genitourinary: Denies dysuria, frequency, urgency, nocturia, hesitancy, discharge, hematuria, flank pain Breast: Breast lumps, nipple discharge, bleeding.  Musculoskeletal: Denies arthralgia, myalgia, stiffness, Jt. Swelling, pain, limp, and strain/sprain. Denies falls. Skin: Denies puritis, rash, hives, warts, acne, eczema, changing in skin lesion Neuro: No weakness, tremor, incoordination, spasms, paresthesia, pain Psychiatric: Denies confusion, memory loss, sensory loss. Denies Depression. Endocrine: Denies change in weight, skin, hair change, nocturia, and paresthesia, diabetic polys, visual blurring, hyper / hypo glycemic episodes.  Heme/Lymph: No excessive bleeding, bruising, enlarged lymph nodes.  Physical Exam  BP (!) 102/58    Pulse 90    Temp 97.9 F (36.6 C)    Resp 16    Ht 5\' 4"  (1.626 m)    Wt 109 lb 6.4 oz (49.6 kg)    SpO2 (!) 16%    BMI 18.78 kg/m   General Appearance: Well nourished, well groomed and in no apparent distress.  Eyes: PERRLA, EOMs, conjunctiva no swelling or erythema, normal fundi and vessels. Sinuses: No frontal/maxillary tenderness ENT/Mouth: EACs patent / TMs  nl. Nares clear without erythema, swelling, mucoid exudates. Oral hygiene is good. No erythema, swelling, or exudate. Tongue normal, non-obstructing. Tonsils not swollen or erythematous. Hearing normal.  Neck: Supple, thyroid not palpable. No bruits, nodes or JVD. Respiratory: Respiratory effort normal.  BS equal and clear bilateral without rales, rhonci, wheezing or stridor. Cardio:  Heart sounds are normal with regular rate and rhythm and no murmurs, rubs or gallops. Peripheral pulses are normal and equal bilaterally without edema. No aortic or femoral bruits. Chest: symmetric with normal excursions and percussion. Breasts: Symmetric, without lumps, nipple discharge, retractions, or fibrocystic changes.  Abdomen: Flat, soft with bowel sounds active. Nontender, no guarding, rebound, hernias, masses, or organomegaly.  Lymphatics: Non tender without lymphadenopathy.  Genitourinary:  Musculoskeletal: Full ROM all peripheral extremities, joint stability, 5/5 strength, and normal gait. Skin: Warm and dry without rashes, lesions, cyanosis, clubbing or  ecchymosis.  Neuro: Cranial nerves intact, reflexes equal bilaterally. Normal muscle tone, no cerebellar symptoms. Sensation intact.  Pysch: Alert and oriented X 3, normal affect, Insight and Judgment appropriate.    Assessment and Plan  1. Annual Preventative Screening Examination  2. Elevated BP without diagnosis of hypertension   3. Attention deficit disorder (ADD) without hyperactivity   4. Lipid screening   5. Abnormal glucose   6. Vitamin D deficiency   7. Hypothyroidism, unspecified type   8. Screening-pulmonary TB   9. Screening for colorectal cancer   10. Screening for heart disease   11. FH: hypertension   12. Fatigue   13. Medication management          Patient was counseled in prudent diet to achieve/maintain BMI less than 25 for weight control, BP monitoring, regular exercise and medications. Discussed med's effects and SE's. Screening labs and tests done last week are reviewed with patient . Over 40 minutes of exam, counseling, chart review and high complex critical decision making was performed.   Kirtland Bouchard, MD

## 2021-03-04 NOTE — Patient Instructions (Signed)
Due to recent changes in healthcare laws, you may see the results of your imaging and laboratory studies on MyChart before your provider has had a chance to review them.  We understand that in some cases there may be results that are confusing or concerning to you. Not all laboratory results come back in the same time frame and the provider may be waiting for multiple results in order to interpret others.  Please give [place] 48 hours in order for your provider to thoroughly review all the results before contacting the office for clarification of your results.   ++++++++++++++++++++++++++++++  Vit D  & Vit C 1,000 mg   are recommended to help protect  against the Covid-19 and other Corona viruses.    Also it's recommended  to take  Zinc 50 mg  to help  protect against the Covid-19   and best place to get  is also on [email or web address]  and don't pay more than 6-8 cents /pill !  ================================ Coronavirus (COVID-19) Are you at risk?  Are you at risk for the Coronavirus (COVID-19)?  To be considered HIGH RISK for Coronavirus (COVID-19), you have to meet the following criteria:  Traveled to [place], [place], [place], [place] or [place]; or in the [place] to [place], [place], [place]  or [place]; and have fever, cough, and shortness of breath within the last 2 weeks of travel OR Been in close contact with a person diagnosed with COVID-19 within the last 2 weeks and have  fever, cough,and shortness of breath  IF YOU DO NOT MEET THESE CRITERIA, YOU ARE CONSIDERED LOW RISK FOR COVID-19.  What to do if you are HIGH RISK for COVID-19?  If you are having a medical emergency, call 911. Seek medical care right away. Before you go to a doctors office, urgent care or emergency department,  call ahead and tell them about your recent travel, contact with someone diagnosed with COVID-19   and your symptoms.  You should receive instructions from your physicians office regarding  next steps of care.  When you arrive at healthcare provider, tell the healthcare staff immediately you have returned from  visiting [place], [place], [place], [place] or [place]; or traveled in the [place] to [place], [place],  [place] or [place] in the last two weeks or you have been in close contact with a person diagnosed with  COVID-19 in the last 2 weeks.   Tell the health care staff about your symptoms: fever, cough and shortness of breath. After you have been seen by a medical provider, you will be either: Tested for (COVID-19) and discharged home on quarantine except to seek medical care if  symptoms worsen, and asked to  Stay home and avoid contact with others until you get your results (4-5 days)  Avoid travel on public transportation if possible (such as bus, train, or airplane) or Sent to the Emergency Department by EMS for evaluation, COVID-19 testing  and  possible admission depending on your condition and test results.  What to do if you are LOW RISK for COVID-19?  Reduce your risk of any infection by using the same precautions used for avoiding the common cold or flu:  Wash your hands often with soap and warm water for at least 20 seconds.  If soap and water are not readily available,  use an alcohol-based hand sanitizer with at least 60% alcohol.  If coughing or sneezing, cover your mouth and nose by  or sneezing into the elbow areas of your shirt or coat,  into a tissue or into your sleeve (not your hands). Avoid shaking hands with others and consider head nods or verbal greetings only. Avoid touching your eyes, nose, or mouth with unwashed hands.  Avoid close contact with people who are sick. Avoid places or events with large numbers of people in one location, like concerts or sporting events. Carefully consider travel plans you have or are making. If you are planning any travel outside or inside the [place], visit the CDC's Travelers' Health webpage for  the latest health notices. If you have some symptoms but not all symptoms, continue to monitor at home and seek medical attention  if your symptoms worsen. If you are having a medical emergency, call 911. >>>>>>>>>>>>>>>>>>>>>>> Preventive Care for Adults  A healthy lifestyle and preventive care can promote health and wellness. Preventive health guidelines for women include the following key practices. A routine yearly physical is a good way to check with your health care provider about your health and preventive screening. It is a chance to share any concerns and updates on your health and to receive a thorough exam. Visit your dentist for a routine exam and preventive care every 6 months. Brush your teeth twice a day and floss once a day. Good oral hygiene prevents tooth decay and gum disease. The frequency of eye exams is based on your age, health, family medical history, use of contact lenses, and other factors. Follow your health care provider's recommendations for frequency of eye exams. Eat a healthy diet. Foods like vegetables, fruits, whole grains, low-fat dairy products, and lean protein foods contain the nutrients you need without too many calories. Decrease your intake of foods high in solid fats, added sugars, and salt. Eat the right amount of calories for you. Get information about a proper diet from your health care provider, if necessary. Regular physical exercise is one of the most important things you can do for your health. Most adults should get at least 150 minutes of moderate-intensity exercise (any activity that increases your heart rate and causes you to sweat) each week. In addition, most adults need muscle-strengthening exercises on 2 or more days a week. Maintain a healthy weight. The body mass index (BMI) is a screening tool to identify possible weight problems. It provides an estimate of body fat based on height and weight. Your health care provider can find your BMI and can  help you achieve or maintain a healthy weight. For adults 20 years and older: A BMI below 18.5 is considered underweight. A BMI of 18.5 to 24.9 is normal. A BMI of 25 to 29.9 is considered overweight. A BMI of 30 and above is considered obese. Maintain normal blood lipids and cholesterol levels by exercising and minimizing your intake of saturated fat. Eat a balanced diet with plenty of fruit and vegetables. Blood tests for lipids and cholesterol should begin at age 20 and be repeated every 5 years. If your lipid or cholesterol levels are high, you are over 50, or you are at high risk for heart disease, you may need your cholesterol levels checked more frequently. Ongoing high lipid and cholesterol levels should be treated with medicines if diet and exercise are not working. If you smoke, find out from your health care provider how to quit. If you do not use tobacco, do not start. Lung cancer screening is recommended for adults aged 55-80 years who are at high risk for developing   developing lung cancer because of a history of smoking. A yearly low-dose CT scan of the lungs is recommended for people who have at least a 30-pack-year history of smoking and are a current smoker or have quit within the past 15 years. A pack year of smoking is smoking an average of 1 pack of cigarettes a day for 1 year (for example: 1 pack a day for 30 years or 2 packs a day for 15 years). Yearly screening should continue until the smoker has stopped smoking for at least 15 years. Yearly screening should be stopped for people who develop a health problem that would prevent them from having lung cancer treatment. High blood pressure causes heart disease and increases the risk of stroke. Your blood pressure should be checked at least every 1 to 2 years. Ongoing high blood pressure should be treated with medicines if weight loss and exercise do not work. If you are 65-76 years old, ask your health care provider if you should take aspirin to  prevent strokes. Diabetes screening involves taking a blood sample to check your fasting blood sugar level. This should be done once every 3 years, after age 66, if you are within normal weight and without risk factors for diabetes. Testing should be considered at a younger age or be carried out more frequently if you are overweight and have at least 1 risk factor for diabetes. Breast cancer screening is essential preventive care for women. You should practice "breast self-awareness." This means understanding the normal appearance and feel of your breasts and may include breast self-examination. Any changes detected, no matter how small, should be reported to a health care provider. Women in their 22s and 30s should have a clinical breast exam (CBE) by a health care provider as part of a regular health exam every 1 to 3 years. After age 56, women should have a CBE every year. Starting at age 12, women should consider having a mammogram (breast X-ray test) every year. Women who have a family history of breast cancer should talk to their health care provider about genetic screening. Women at a high risk of breast cancer should talk to their health care providers about having an MRI and a mammogram every year. Breast cancer gene (BRCA)-related cancer risk assessment is recommended for women who have family members with BRCA-related cancers. BRCA-related cancers include breast, ovarian, tubal, and peritoneal cancers. Having family members with these cancers may be associated with an increased risk for harmful changes (mutations) in the breast cancer genes BRCA1 and BRCA2. Results of the assessment will determine the need for genetic counseling and BRCA1 and BRCA2 testing. Routine pelvic exams to screen for cancer are no longer recommended for nonpregnant women who are considered low risk for cancer of the pelvic organs (ovaries, uterus, and vagina) and who do not have symptoms. Ask your health care provider if a  screening pelvic exam is right for you. If you have had past treatment for cervical cancer or a condition that could lead to cancer, you need Pap tests and screening for cancer for at least 20 years after your treatment. If Pap tests have been discontinued, your risk factors (such as having a new sexual partner) need to be reassessed to determine if screening should be resumed. Some women have medical problems that increase the chance of getting cervical cancer. In these cases, your health care provider may recommend more frequent screening and Pap tests. Colorectal cancer can be detected and often prevented. Most routine  colorectal cancer screening begins at the age of 6 years and continues through age 31 years. However, your health care provider may recommend screening at an earlier age if you have risk factors for colon cancer. On a yearly basis, your health care provider may provide home test kits to check for hidden blood in the stool. Use of a small camera at the end of a tube, to directly examine the colon (sigmoidoscopy or colonoscopy), can detect the earliest forms of colorectal cancer. Talk to your health care provider about this at age 49, when routine screening begins.  Direct exam of the colon should be repeated every 5-10 years through age 51 years, unless early forms of pre-cancerous polyps or small growths are found. Hepatitis C blood testing is recommended for all people born from [birth year] through 1965 and any individual with known risks for hepatitis C.  Osteoporosis is a disease in which the bones lose minerals and strength with aging. This can result in serious bone fractures or breaks. The risk of osteoporosis can be identified using a bone density scan. Women ages 67 years and over and women at risk for fractures or osteoporosis should discuss screening with their health care providers. Ask your health care provider whether you should take a calcium supplement or vitamin D to reduce the rate  of osteoporosis. Menopause can be associated with physical symptoms and risks. Hormone replacement therapy is available to decrease symptoms and risks. You should talk to your health care provider about whether hormone replacement therapy is right for you. Use sunscreen. Apply sunscreen liberally and repeatedly throughout the day. You should seek shade when your shadow is shorter than you. Protect yourself by wearing long sleeves, pants, a wide-brimmed hat, and sunglasses year round, whenever you are outdoors. Once a month, do a whole body skin exam, using a mirror to look at the skin on your back. Tell your health care provider of new moles, moles that have irregular borders, moles that are larger than a pencil eraser, or moles that have changed in shape or color. Stay current with required vaccines (immunizations). Influenza vaccine. All adults should be immunized every year. Tetanus, diphtheria, and acellular pertussis (Td, Tdap) vaccine. Pregnant women should receive 1 dose of Tdap vaccine during each pregnancy. The dose should be obtained regardless of the length of time since the last dose. Immunization is preferred during the 27th-36th week of gestation. An adult who has not previously received Tdap or who does not know her vaccine status should receive 1 dose of Tdap. This initial dose should be followed by tetanus and diphtheria toxoids (Td) booster doses every 10 years. Adults with an unknown or incomplete history of completing a 3-dose immunization series with Td-containing vaccines should begin or complete a primary immunization series including a Tdap dose. Adults should receive a Td booster every 10 years. Varicella vaccine. An adult without evidence of immunity to varicella should receive 2 doses or a second dose if she has previously received 1 dose. Pregnant females who do not have evidence of immunity should receive the first dose after pregnancy. This first dose should be obtained before  leaving the health care facility. The second dose should be obtained 4-8 weeks after the first dose. Human papillomavirus (HPV) vaccine. Females aged 13-26 years who have not received the vaccine previously should obtain the 3-dose series. The vaccine is not recommended for use in pregnant females. However, pregnancy testing is not needed before receiving a dose. If a female is found  be pregnant after receiving a dose, no treatment is needed. In that case, the remaining doses should be delayed until after the pregnancy. Immunization is recommended for any person with an immunocompromised condition through the age of 26 years if she did not get any or all doses earlier. During the 3-dose series, the second dose should be obtained 4-8 weeks after the first dose. The third dose should be obtained 24 weeks after the first dose and 16 weeks after the second dose. Zoster vaccine. One dose is recommended for adults aged 60 years or older unless certain conditions are present. Measles, mumps, and rubella (MMR) vaccine. Adults born before 1957 generally are considered immune to measles and mumps. Adults born in 1957 or later should have 1 or more doses of MMR vaccine unless there is a contraindication to the vaccine or there is laboratory evidence of immunity to each of the three diseases. A routine second dose of MMR vaccine should be obtained at least 28 days after the first dose for students attending postsecondary schools, health care workers, or international travelers. People who received inactivated measles vaccine or an unknown type of measles vaccine during 1963-1967 should receive 2 doses of MMR vaccine. People who received inactivated mumps vaccine or an unknown type of mumps vaccine before 1979 and are at high risk for mumps infection should consider immunization with 2 doses of MMR vaccine. For females of childbearing age, rubella immunity should be determined. If there is no evidence of immunity, females  who are not pregnant should be vaccinated. If there is no evidence of immunity, females who are pregnant should delay immunization until after pregnancy. Unvaccinated health care workers born before 1957 who lack laboratory evidence of measles, mumps, or rubella immunity or laboratory confirmation of disease should consider measles and mumps immunization with 2 doses of MMR vaccine or rubella immunization with 1 dose of MMR vaccine. Pneumococcal 13-valent conjugate (PCV13) vaccine. When indicated, a person who is uncertain of her immunization history and has no record of immunization should receive the PCV13 vaccine. An adult aged 19 years or older who has certain medical conditions and has not been previously immunized should receive 1 dose of PCV13 vaccine. This PCV13 should be followed with a dose of pneumococcal polysaccharide (PPSV23) vaccine. The PPSV23 vaccine dose should be obtained at least 1 or more year(s) after the dose of PCV13 vaccine. An adult aged 19 years or older who has certain medical conditions and previously received 1 or more doses of PPSV23 vaccine should receive 1 dose of PCV13. The PCV13 vaccine dose should be obtained 1 or more years after the last PPSV23 vaccine dose.  Pneumococcal polysaccharide (PPSV23) vaccine. When PCV13 is also indicated, PCV13 should be obtained first. All adults aged 65 years and older should be immunized. An adult younger than age 65 years who has certain medical conditions should be immunized. Any person who resides in a nursing home or long-term care facility should be immunized. An adult smoker should be immunized. People with an immunocompromised condition and certain other conditions should receive both PCV13 and PPSV23 vaccines. People with human immunodeficiency virus (HIV) infection should be immunized as soon as possible after diagnosis. Immunization during chemotherapy or radiation therapy should be avoided. Routine use of PPSV23 vaccine is not  recommended for American Indians, [place] Natives, or people younger than 65 years unless there are medical conditions that require PPSV23 vaccine. When indicated, people who have unknown immunization and have no record of immunization should receive   PPSV23 vaccine. One-time revaccination 5 years after the first dose of PPSV23 is recommended for people aged 19-64 years who have chronic kidney failure, nephrotic syndrome, asplenia, or immunocompromised conditions. People who received 1-2 doses of PPSV23 before age 65 years should receive another dose of PPSV23 vaccine at age 65 years or later if at least 5 years have passed since the previous dose. Doses of PPSV23 are not needed for people immunized with PPSV23 at or after age 65 years.  Preventive Services / Frequency  Ages 40 to 64 years Blood pressure check. Lipid and cholesterol check. Lung cancer screening. / Every year if you are aged 55-80 years and have a 30-pack-year history of smoking and currently smoke or have quit within the past 15 years. Yearly screening is stopped once you have quit smoking for at least 15 years or develop a health problem that would prevent you from having lung cancer treatment. Clinical breast exam.** / Every year after age 40 years.  BRCA-related cancer risk assessment.** / For women who have family members with a BRCA-related cancer (breast, ovarian, tubal, or peritoneal cancers). Mammogram.** / Every year beginning at age 40 years and continuing for as long as you are in good health. Consult with your health care provider. Pap test.** / Every 3 years starting at age 30 years through age 65 or 70 years with a history of 3 consecutive normal Pap tests. HPV screening.** / Every 3 years from ages 30 years through ages 65 to 70 years with a history of 3 consecutive normal Pap tests. Fecal occult blood test (FOBT) of stool. / Every year beginning at age 50 years and continuing until age 75 years. You may not need to do this  test if you get a colonoscopy every 10 years. Flexible sigmoidoscopy or colonoscopy.** / Every 5 years for a flexible sigmoidoscopy or every 10 years for a colonoscopy beginning at age 50 years and continuing until age 75 years. Hepatitis C blood test.** / For all people born from 1945 through 1965 and any individual with known risks for hepatitis C. Skin self-exam. / Monthly. Influenza vaccine. / Every year. Tetanus, diphtheria, and acellular pertussis (Tdap/Td) vaccine.** / Consult your health care provider. Pregnant women should receive 1 dose of Tdap vaccine during each pregnancy. 1 dose of Td every 10 years. Varicella vaccine.** / Consult your health care provider. Pregnant females who do not have evidence of immunity should receive the first dose after pregnancy. Zoster vaccine.** / 1 dose for adults aged 60 years or older. Pneumococcal 13-valent conjugate (PCV13) vaccine.** / Consult your health care provider. Pneumococcal polysaccharide (PPSV23) vaccine.** / 1 to 2 doses if you smoke cigarettes or if you have certain conditions. Meningococcal vaccine.** / Consult your health care provider. Hepatitis A vaccine.** / Consult your health care provider. Hepatitis B vaccine.** / Consult your health care provider. Screening for abdominal aortic aneurysm (AAA)  by ultrasound is recommended for people over 50 who have history of high blood pressure or who are current or former smokers. ++++++++++++++++++ Recommend Adult Low Dose Aspirin or  coated  Aspirin 81 mg daily  To reduce risk of Colon Cancer 40 %,  Skin Cancer 26 % ,  Melanoma 46%  and  Pancreatic cancer 60% +++++++++++++++++++ Vitamin D goal  is between 70-100.  Please make sure that you are taking your Vitamin D as directed.  It is very important as a natural anti-inflammatory  helping hair, skin, and nails, as well as reducing stroke and heart   attack risk.  It helps your bones and helps with mood. It also decreases numerous  cancer risks so please take it as directed.  Low Vit D is associated with a 200-300% higher risk for CANCER  and 200-300% higher risk for HEART   ATTACK  &  STROKE.   ...................................... It is also associated with higher death rate at younger ages,  autoimmune diseases like Rheumatoid arthritis, Lupus, Multiple Sclerosis.    Also many other serious conditions, like depression, Alzheimer's Dementia, infertility, muscle aches, fatigue, fibromyalgia - just to name a few. ++++++++++++++++++ Recommend the book "The END of DIETING" by Dr Joel Fuhrman  & the book "The END of DIABETES " by Dr Joel Fuhrman At [employer] - get book & Audio CD's    Being diabetic has a  300% increased risk for heart attack, stroke, cancer, and alzheimer- type vascular dementia. It is very important that you work harder with diet by avoiding all foods that are white. Avoid white rice (brown & wild rice is OK), white potatoes (sweetpotatoes in moderation is OK), White bread or wheat bread or anything made out of white flour like bagels, donuts, rolls, buns, biscuits, cakes, pastries, cookies, pizza crust, and pasta (made from white flour & egg whites) - vegetarian pasta or spinach or wheat pasta is OK. Multigrain breads like Arnold's or [employer], or multigrain sandwich thins or flatbreads.  Diet, exercise and weight loss can reverse and cure diabetes in the early stages.  Diet, exercise and weight loss is very important in the control and prevention of complications of diabetes which affects every system in your body, ie. Brain - dementia/stroke, eyes - glaucoma/blindness, heart - heart attack/heart failure, kidneys - dialysis, stomach - gastric paralysis, intestines - malabsorption, nerves - severe painful neuritis, circulation - gangrene & loss of a leg(s), and finally cancer and Alzheimers.    I recommend avoid fried & greasy foods,  sweets/candy, white rice (brown or wild rice or Quinoa is OK), white  potatoes (sweet potatoes are OK) - anything made from white flour - bagels, doughnuts, rolls, buns, biscuits,white and wheat breads, pizza crust and traditional pasta made of white flour & egg white(vegetarian pasta or spinach or wheat pasta is OK).  Multi-grain bread is OK - like multi-grain flat bread or sandwich thins. Avoid alcohol in excess. Exercise is also important.    Eat all the vegetables you want - avoid meat, especially red meat and dairy - especially cheese.  Cheese is the most concentrated form of trans-fats which is the worst thing to clog up our arteries. Veggie cheese is OK which can be found in the fresh produce section at [employer] or [employer] or [employer]  ++++++++++++++++++++++ DASH Eating Plan  DASH stands for "Dietary Approaches to Stop Hypertension."   The DASH eating plan is a healthy eating plan that has been shown to reduce high blood pressure (hypertension). Additional health benefits may include reducing the risk of type 2 diabetes mellitus, heart disease, and stroke. The DASH eating plan may also help with weight loss. WHAT DO I NEED TO KNOW ABOUT THE DASH EATING PLAN? For the DASH eating plan, you will follow these general guidelines: Choose foods with a percent daily value for sodium of less than 5% (as listed on the food label). Use salt-free seasonings or herbs instead of table salt or sea salt. Check with your health care provider or pharmacist before using salt substitutes. Eat lower-sodium products, often labeled as "lower sodium" or "no   salt added." Eat fresh foods. Eat more vegetables, fruits, and low-fat dairy products. Choose whole grains. Look for the word "whole" as the first word in the ingredient list. Choose fish  Limit sweets, desserts, sugars, and sugary drinks. Choose heart-healthy fats. Eat veggie cheese  Eat more home-cooked food and less restaurant, buffet, and fast food. Limit fried foods. Cook foods using methods other than  frying. Limit canned vegetables. If you do use them, rinse them well to decrease the sodium. When eating at a restaurant, ask that your food be prepared with less salt, or no salt if possible.                      WHAT FOODS CAN I EAT? Read Dr Joel Fuhrman's books on The End of Dieting & The End of Diabetes  Grains Whole grain or whole wheat bread. Brown rice. Whole grain or whole wheat pasta. Quinoa, bulgur, and whole grain cereals. Low-sodium cereals. Corn or whole wheat flour tortillas. Whole grain cornbread. Whole grain crackers. Low-sodium crackers.  Vegetables Fresh or frozen vegetables (raw, steamed, roasted, or grilled). Low-sodium or reduced-sodium tomato and vegetable juices. Low-sodium or reduced-sodium tomato sauce and paste. Low-sodium or reduced-sodium canned vegetables.   Fruits All fresh, canned (in natural juice), or frozen fruits.  Protein Products  All fish and seafood.  Dried beans, peas, or lentils. Unsalted nuts and seeds. Unsalted canned beans.  Dairy Low-fat dairy products, such as skim or 1% milk, 2% or reduced-fat cheeses, low-fat ricotta or cottage cheese, or plain low-fat yogurt. Low-sodium or reduced-sodium cheeses.  Fats and Oils Tub margarines without trans fats. Light or reduced-fat mayonnaise and salad dressings (reduced sodium). Avocado. Safflower, olive, or canola oils. Natural peanut or almond butter.  Other Unsalted popcorn and pretzels. The items listed above may not be a complete list of recommended foods or beverages. Contact your dietitian for more options.  ++++++++++++++++++  WHAT FOODS ARE NOT RECOMMENDED? Grains/ White flour or wheat flour White bread. White pasta. White rice. Refined cornbread. Bagels and croissants. Crackers that contain trans fat.  Vegetables  Creamed or fried vegetables. Vegetables in a . Regular canned vegetables. Regular canned tomato sauce and paste. Regular tomato and vegetable juices.  Fruits Dried fruits.  Canned fruit in light or heavy syrup. Fruit juice.  Meat and Other Protein Products Meat in general - RED meat & White meat.  Fatty cuts of meat. Ribs, chicken wings, all processed meats as bacon, sausage, bologna, salami, fatback, hot dogs, bratwurst and packaged luncheon meats.  Dairy Whole or 2% milk, cream, half-and-half, and cream cheese. Whole-fat or sweetened yogurt. Full-fat cheeses or blue cheese. Non-dairy creamers and whipped toppings. Processed cheese, cheese spreads, or cheese curds.  Condiments Onion and garlic salt, seasoned salt, table salt, and sea salt. Canned and packaged gravies. Worcestershire sauce. Tartar sauce. Barbecue sauce. Teriyaki sauce. Soy sauce, including reduced sodium. Steak sauce. Fish sauce. Oyster sauce. Cocktail sauce. Horseradish. Ketchup and mustard. Meat flavorings and tenderizers. Bouillon cubes. Hot sauce. Tabasco sauce. Marinades. Taco seasonings. Relishes.  Fats and Oils Butter, stick margarine, lard, shortening and bacon fat. Coconut, palm kernel, or palm oils. Regular salad dressings.  Pickles and olives. Salted popcorn and pretzels.  The items listed above may not be a complete list of foods and beverages to avoid.   

## 2021-03-05 ENCOUNTER — Other Ambulatory Visit: Payer: Self-pay

## 2021-03-05 ENCOUNTER — Ambulatory Visit (INDEPENDENT_AMBULATORY_CARE_PROVIDER_SITE_OTHER): Payer: 59 | Admitting: Internal Medicine

## 2021-03-05 ENCOUNTER — Encounter: Payer: Self-pay | Admitting: Internal Medicine

## 2021-03-05 VITALS — BP 102/58 | HR 90 | Temp 97.9°F | Resp 16 | Ht 64.0 in | Wt 109.4 lb

## 2021-03-05 DIAGNOSIS — Z111 Encounter for screening for respiratory tuberculosis: Secondary | ICD-10-CM

## 2021-03-05 DIAGNOSIS — Z1211 Encounter for screening for malignant neoplasm of colon: Secondary | ICD-10-CM

## 2021-03-05 DIAGNOSIS — R030 Elevated blood-pressure reading, without diagnosis of hypertension: Secondary | ICD-10-CM | POA: Diagnosis not present

## 2021-03-05 DIAGNOSIS — Z0000 Encounter for general adult medical examination without abnormal findings: Secondary | ICD-10-CM | POA: Diagnosis not present

## 2021-03-05 DIAGNOSIS — Z8249 Family history of ischemic heart disease and other diseases of the circulatory system: Secondary | ICD-10-CM | POA: Diagnosis not present

## 2021-03-05 DIAGNOSIS — E039 Hypothyroidism, unspecified: Secondary | ICD-10-CM

## 2021-03-05 DIAGNOSIS — R5383 Other fatigue: Secondary | ICD-10-CM

## 2021-03-05 DIAGNOSIS — Z0001 Encounter for general adult medical examination with abnormal findings: Secondary | ICD-10-CM

## 2021-03-05 DIAGNOSIS — F988 Other specified behavioral and emotional disorders with onset usually occurring in childhood and adolescence: Secondary | ICD-10-CM

## 2021-03-05 DIAGNOSIS — R7309 Other abnormal glucose: Secondary | ICD-10-CM

## 2021-03-05 DIAGNOSIS — Z136 Encounter for screening for cardiovascular disorders: Secondary | ICD-10-CM | POA: Diagnosis not present

## 2021-03-05 DIAGNOSIS — Z1212 Encounter for screening for malignant neoplasm of rectum: Secondary | ICD-10-CM

## 2021-03-05 DIAGNOSIS — E559 Vitamin D deficiency, unspecified: Secondary | ICD-10-CM

## 2021-03-05 DIAGNOSIS — Z13220 Encounter for screening for lipoid disorders: Secondary | ICD-10-CM

## 2021-03-05 DIAGNOSIS — Z79899 Other long term (current) drug therapy: Secondary | ICD-10-CM

## 2021-03-24 ENCOUNTER — Other Ambulatory Visit: Payer: Self-pay

## 2021-08-11 ENCOUNTER — Other Ambulatory Visit: Payer: Self-pay

## 2021-08-11 DIAGNOSIS — Z1211 Encounter for screening for malignant neoplasm of colon: Secondary | ICD-10-CM

## 2021-08-11 DIAGNOSIS — Z1212 Encounter for screening for malignant neoplasm of rectum: Secondary | ICD-10-CM

## 2021-08-11 LAB — POC HEMOCCULT BLD/STL (HOME/3-CARD/SCREEN)
Card #2 Fecal Occult Blod, POC: NEGATIVE
Card #3 Fecal Occult Blood, POC: NEGATIVE
Fecal Occult Blood, POC: NEGATIVE

## 2021-09-01 ENCOUNTER — Other Ambulatory Visit: Payer: Self-pay | Admitting: Internal Medicine

## 2021-09-01 DIAGNOSIS — F988 Other specified behavioral and emotional disorders with onset usually occurring in childhood and adolescence: Secondary | ICD-10-CM

## 2021-09-26 ENCOUNTER — Encounter: Payer: Self-pay | Admitting: Family Medicine

## 2021-09-26 ENCOUNTER — Ambulatory Visit: Payer: Self-pay

## 2021-09-26 ENCOUNTER — Ambulatory Visit: Payer: Commercial Managed Care - HMO | Admitting: Family Medicine

## 2021-09-26 VITALS — BP 100/62 | Ht 64.0 in | Wt 109.0 lb

## 2021-09-26 DIAGNOSIS — G5622 Lesion of ulnar nerve, left upper limb: Secondary | ICD-10-CM | POA: Diagnosis not present

## 2021-09-26 DIAGNOSIS — M25522 Pain in left elbow: Secondary | ICD-10-CM

## 2021-09-26 NOTE — Assessment & Plan Note (Signed)
Acutely occurring. Having reproduction of the pain on exam with the ultrasound. Appears to be located as it exits the triceps.  - counseled on home exercise therapy and supportive care - can try topicals.  - could consider injection.

## 2021-09-26 NOTE — Progress Notes (Signed)
  Maria Crawford - 46 y.o. female MRN [id number]  Date of birth: 03/23/1975  SUBJECTIVE:  Including CC & ROS.  No chief complaint on file.   Maria Crawford is a 46 y.o. female that is  presenting with left arm pain. Pain has been ongoing since January. Intermittent in nature.   Review of Systems See HPI   HISTORY: Past Medical, Surgical, Social, and Family History Reviewed & Updated per EMR.   Pertinent Historical Findings include:  Past Medical History:  Diagnosis Date   Allergy    Asthma    Thyroid disease     History reviewed. No pertinent surgical history.   PHYSICAL EXAM:  VS: BP 100/62 (BP Location: Left Arm, Patient Position: Sitting)   Ht 5\' 4"  (1.626 m)   Wt 109 lb (49.4 kg)   BMI 18.71 kg/m  Physical Exam Gen: NAD, alert, cooperative with exam, well-appearing MSK:  Neurovascularly intact    Limited ultrasound: left arm:  The ulnar nerve as it course out of the triceps shows to be thickened and sensitive as it is viewed.  Larger enlargement in the cubital tunnel when compared proximal to the triceps exit  Summary: findings consistent with ulnar neuritis   Ultrasound and interpretation by , MD    ASSESSMENT & PLAN:   Ulnar neuritis, left Acutely occurring. Having reproduction of the pain on exam with the ultrasound. Appears to be located as it exits the triceps.  - counseled on home exercise therapy and supportive care - can try topicals.  - could consider injection.

## 2021-09-26 NOTE — Patient Instructions (Signed)
Good to see you Please try the topical lidocaine and voltaren   Please send me a message in MyChart with any questions or updates.  Please see me back as needed.   --Dr. Jordan Likes

## 2021-10-24 ENCOUNTER — Other Ambulatory Visit: Payer: Self-pay | Admitting: Family Medicine

## 2021-10-24 ENCOUNTER — Ambulatory Visit (HOSPITAL_BASED_OUTPATIENT_CLINIC_OR_DEPARTMENT_OTHER)
Admission: RE | Admit: 2021-10-24 | Discharge: 2021-10-24 | Disposition: A | Discharge status: Home / Self Care | Payer: Commercial Managed Care - HMO | Source: Ambulatory Visit | Attending: Family Medicine | Admitting: Family Medicine

## 2021-10-24 DIAGNOSIS — G5622 Lesion of ulnar nerve, left upper limb: Secondary | ICD-10-CM

## 2022-01-30 ENCOUNTER — Other Ambulatory Visit: Payer: Self-pay | Admitting: Internal Medicine

## 2022-01-30 DIAGNOSIS — J4520 Mild intermittent asthma, uncomplicated: Secondary | ICD-10-CM

## 2022-01-30 DIAGNOSIS — Z1211 Encounter for screening for malignant neoplasm of colon: Secondary | ICD-10-CM

## 2022-01-30 MED ORDER — TRELEGY ELLIPTA 200-62.5-25 MCG/ACT IN AEPB: INHALATION_SPRAY | RESPIRATORY_TRACT | 3 refills | Status: AC

## 2022-01-30 MED ORDER — DEXAMETHASONE 4 MG PO TABS: ORAL_TABLET | ORAL | 0 refills | Status: DC

## 2022-02-03 ENCOUNTER — Encounter: Payer: BC Managed Care – PPO | Admitting: Internal Medicine

## 2022-02-16 ENCOUNTER — Other Ambulatory Visit: Payer: Self-pay | Admitting: Internal Medicine

## 2022-02-16 DIAGNOSIS — J40 Bronchitis, not specified as acute or chronic: Secondary | ICD-10-CM

## 2022-02-16 MED ORDER — PROAIR HFA 108 (90 BASE) MCG/ACT IN AERS: INHALATION_SPRAY | RESPIRATORY_TRACT | 3 refills | Status: AC

## 2022-02-25 DIAGNOSIS — Z1321 Encounter for screening for nutritional disorder: Secondary | ICD-10-CM | POA: Diagnosis not present

## 2022-02-25 DIAGNOSIS — Z01419 Encounter for gynecological examination (general) (routine) without abnormal findings: Secondary | ICD-10-CM | POA: Diagnosis not present

## 2022-02-25 DIAGNOSIS — Z681 Body mass index (BMI) 19 or less, adult: Secondary | ICD-10-CM | POA: Diagnosis not present

## 2022-02-25 DIAGNOSIS — R87810 Cervical high risk human papillomavirus (HPV) DNA test positive: Secondary | ICD-10-CM | POA: Diagnosis not present

## 2022-02-25 DIAGNOSIS — Z118 Encounter for screening for other infectious and parasitic diseases: Secondary | ICD-10-CM | POA: Diagnosis not present

## 2022-02-25 LAB — HM MAMMOGRAPHY

## 2022-03-02 LAB — COLOGUARD

## 2022-03-10 ENCOUNTER — Ambulatory Visit: Payer: BC Managed Care – PPO | Admitting: Internal Medicine

## 2022-03-10 ENCOUNTER — Encounter: Payer: Self-pay | Admitting: Internal Medicine

## 2022-03-10 NOTE — Progress Notes (Signed)
Maria Crawford                                                                                                                                                                                                                                                         Annual Screening/Preventative Visit & Comprehensive Evaluation &  Examination   Future Appointments  Date Time Provider Department  03/10/2022 11:00 AM Unk Pinto, MD [facility]  03/17/2023 11:00 AM Unk Pinto, MD [facility]        This very nice 47 y.o.  MWF presents for a Screening /Preventative Visit & comprehensive evaluation and management of multiple medical co-morbidities.  Patient has been followed for  BP Screening,  Hx/o abn glucose, ADD & Vitamin D Deficiency, Pre-Diabetes, hx/o Asthma and Vitamin D Deficiency.         Patient has been prescribed thyroid extract by another provider.        Patient has hx/o ADD treated in the past with Bupropion. Treatment in the past with Ritalin improved focus, concentration in time management with work tasks. More recently she had been seen by Dr Vivia Ewing and was treated  with Amphetamine 10 mg salt and taking only 1/4 tablet , she had favorable response.         Patient is followed expectantly for elevated BP  & BP 's have been controlled. Patient denies any cardiac symptoms as chest pain, palpitations, shortness of breath, dizziness or ankle swelling. Today's BP is at goal at                       .        Patient's lipids  are controlled with diet. Last lipids were at goal :  Lab Results  Component Value Date   CHOL 144 02/28/2021   HDL 60 02/28/2021   LDLCALC 69 02/28/2021   TRIG 72 02/28/2021    CHOLHDL 2.4 02/28/2021         Patient is monitored for glucose intolerance  and patient denies reactive hypoglycemic symptoms, visual blurring, diabetic polys or paresthesias. Recent  A1c was at goal :  Lab Results  Component Value Date   HGBA1C 4.9 02/28/2021  Finally, patient has history of Vitamin D Deficiency ("36" /May 2022) and last Vitamin D was at goal ( betw 70-100 ) :  Lab Results  Component Value Date   VD25OH 76 02/28/2021     Current Outpatient Medications on File Prior to Visit  Medication Sig   Amphetamine Sulfate 10 MG TABS Take 1 tablet  daily. Takes 1/4 tablet Daily   buPROPion XL 300 MG  Take 1 tablet  Daily for Mood, Focus & Concentration   TRELEGY ELLIPTA 100-62.5-25 A Inhale 1 puff  daily.   montelukast  10 MG tablet Take     1 tablet     Daily     for  Allergies   Multiple Vitamins-Minerals  Take 1 tablet daily.   Omega-3 FISH OIL  Take 1 capsule daily.   PROAIR HFA  inhaler 1 to 2 inhalations 1 every 4 hours as needed    Thyroid 48.75 MG TABS Take 1 tablet  2 times daily.     Allergies  Allergen Reactions   Penicillins REACTION: hives     Past Medical History:  Diagnosis Date   Allergy    Asthma    Thyroid disease      Health Maintenance  Topic Date Due   COVID-19 Vaccine (1) Never done   HIV Screening  Never done   Hepatitis C Screening  Never done   TETANUS/TDAP  Never done   PAP SMEAR-Modifier  Never done   INFLUENZA VACCINE  Never done   HPV VACCINES  Aged Out     Last [facility]GM - Diagnostic & U/S pending 03/24/2021    No past surgical history on file.   Family History  Problem Relation Age of Onset   Hypertension Mother    Cancer , prostate Father           Autism Brother      Social History   Tobacco Use   Smoking status: Never   Smokeless tobacco: Never  Substance Use Topics   Alcohol use: Yes   Drug use: No      ROS Constitutional: Denies fever, chills, weight loss/gain, headaches, insomnia,  night  sweats, and change in appetite. Does c/o fatigue. Eyes: Denies redness, blurred vision, diplopia, discharge, itchy, watery eyes.  ENT: Denies discharge, congestion, post nasal drip, epistaxis, sore throat, earache, hearing loss, dental pain, Tinnitus, Vertigo, Sinus pain, snoring.  Cardio: Denies chest pain, palpitations, irregular heartbeat, syncope, dyspnea, diaphoresis, orthopnea, PND, claudication, edema Respiratory: denies cough, dyspnea, DOE, pleurisy, hoarseness, laryngitis, wheezing.  Gastrointestinal: Denies dysphagia, heartburn, reflux, water brash, pain, cramps, nausea, vomiting, bloating, diarrhea, constipation, hematemesis, melena, hematochezia, jaundice, hemorrhoids Genitourinary: Denies dysuria, frequency, urgency, nocturia, hesitancy, discharge, hematuria, flank pain Breast: Breast lumps, nipple discharge, bleeding.  Musculoskeletal: Denies arthralgia, myalgia, stiffness, Jt. Swelling, pain, limp, and strain/sprain. Denies falls. Skin: Denies puritis, rash, hives, warts, acne, eczema, changing in skin lesion Neuro: No weakness, tremor, incoordination, spasms, paresthesia, pain Psychiatric: Denies confusion, memory loss, sensory loss. Denies Depression. Endocrine: Denies change in weight, skin, hair change, nocturia, and paresthesia, diabetic polys, visual blurring, hyper / hypo glycemic episodes.  Heme/Lymph: No excessive bleeding, bruising, enlarged lymph nodes.  Physical Exam  BP (!) 102/58   Pulse 90   Temp 97.9 F (36.6 C)   Resp 16   Ht '5\' 4"'$  (1.626 m)   Wt 109 lb 6.4 oz (49.6 kg)   SpO2 (!) 16%   BMI 18.78 kg/m   General Appearance: Well nourished, well groomed and in  no apparent distress.  Eyes: PERRLA, EOMs, conjunctiva no swelling or erythema, normal fundi and vessels. Sinuses: No frontal/maxillary tenderness ENT/Mouth: EACs patent / TMs  nl. Nares clear without erythema, swelling, mucoid exudates. Oral hygiene is good. No erythema, swelling, or exudate.  Tongue normal, non-obstructing. Tonsils not swollen or erythematous. Hearing normal.  Neck: Supple, thyroid not palpable. No bruits, nodes or JVD. Respiratory: Respiratory effort normal.  BS equal and clear bilateral without rales, rhonci, wheezing or stridor. Cardio: Heart sounds are normal with regular rate and rhythm and no murmurs, rubs or gallops. Peripheral pulses are normal and equal bilaterally without edema. No aortic or femoral bruits. Chest: symmetric with normal excursions and percussion. Breasts: Symmetric, without lumps, nipple discharge, retractions, or fibrocystic changes.  Abdomen: Flat, soft with bowel sounds active. Nontender, no guarding, rebound, hernias, masses, or organomegaly.  Lymphatics: Non tender without lymphadenopathy.  Genitourinary:  Musculoskeletal: Full ROM all peripheral extremities, joint stability, 5/5 strength, and normal gait. Skin: Warm and dry without rashes, lesions, cyanosis, clubbing or  ecchymosis.  Neuro: Cranial nerves intact, reflexes equal bilaterally. Normal muscle tone, no cerebellar symptoms. Sensation intact.  Pysch: Alert and oriented X 3, normal affect, Insight and Judgment appropriate.    Assessment and Plan  1. Annual Preventative Screening Examination   2. Attention deficit disorder (ADD) without hyperactivity   3. Elevated BP without diagnosis of hypertension  - EKG 12-Lead - [place], RETROPERITNL ABD,  LTD - Urinalysis, Routine w reflex microscopic - Microalbumin / creatinine urine ratio - CBC with Differential/Platelet - COMPLETE METABOLIC PANEL WITH GFR - Magnesium - TSH  4. Lipid screening  - EKG 12-Lead - [place], RETROPERITNL ABD,  LTD - Lipid panel - TSH  5. Abnormal glucose  - EKG 12-Lead - [place], RETROPERITNL ABD,  LTD - Hemoglobin A1c - Insulin, random  6. Vitamin D deficiency  - VITAMIN D 25 Hydroxy  7. Hypothyroidism  - TSH  8. Screening-pulmonary TB  - TB Skin Test  9. Screening for colorectal  cancer  - Cologard pending  10. Screening for heart disease  - EKG 12-Lead  11. FH: hypertension  - EKG 12-Lead - [place], RETROPERITNL ABD,  LTD  12. Screening for AAA (aortic abdominal aneurysm)  - [place], RETROPERITNL ABD,  LTD  13. Fatigue, unspecified type  - Iron, Total/Total Iron Binding Cap - Vitamin B12 - CBC with Differential/Platelet - TSH  14. Medication management  - Urinalysis, Routine w reflex microscopic - Microalbumin / creatinine urine ratio - CBC with Differential/Platelet - COMPLETE METABOLIC PANEL WITH GFR - Magnesium - Lipid panel - TSH - Hemoglobin A1c - Insulin, random - VITAMIN D 25 Hydroxy           Patient was counseled in prudent diet to achieve/maintain BMI less than 25 for weight control, BP monitoring, regular exercise and medications. Discussed med's effects and SE's. Screening labs and tests done last week are reviewed with patient . Over 40 minutes of exam, counseling, chart review and high complex critical decision making was performed.   Kirtland Bouchard, MD

## 2022-03-12 ENCOUNTER — Encounter: Payer: Self-pay | Admitting: Internal Medicine

## 2022-03-12 NOTE — Patient Instructions (Addendum)
Due to recent changes in healthcare laws, you may see the results of your imaging and laboratory studies on MyChart before your provider has had a chance to review them.  We understand that in some cases there may be results that are confusing or concerning to you. Not all laboratory results come back in the same time frame and the provider may be waiting for multiple results in order to interpret others.  Please give [place] 48 hours in order for your provider to thoroughly review all the results before contacting the office for clarification of your results.  ++++++++++++++++++++++++++++++  Living With Attention Deficit Hyperactivity Disorder  If you have been diagnosed with attention deficit hyperactivity disorder (ADHD), you may be relieved that you now know why you have felt or behaved a certain way. Still, you may feel overwhelmed about the treatment ahead. You may also wonder how to get the support you need and how to deal with the condition day-to-day. With treatment and support, you can live with ADHD and manage your symptoms. How to manage lifestyle changes Managing lifestyle changes can be challenging. Seeking support from your healthcare provider, therapist, family, and friends can be helpful. How to recognize changes in your condition The following signs may mean that your treatment is working well and your condition is improving: Consistently being on time for appointments. Being more organized at home and work. Other people noticing improvements in your behavior. Achieving goals that you set for yourself. Thinking more clearly. The following signs may mean that your treatment is not working very well: Feeling impatience or more confusion. Missing, forgetting, or being late for appointments. An increasing sense of disorganization and messiness. More difficulty in reaching goals that you set for yourself. Loved ones becoming angry or frustrated with you. Follow these instructions at  home: Medicines Take over-the-counter and prescription medicines only as told by your health care provider. Check with your health care provider before taking any new medicines. General instructions Create structure and an organized atmosphere at home. For example: Make a list of tasks, then rank them from most important to least important. Work on one task at a time until your listed tasks are done. Make a daily schedule and follow it consistently every day. Use an appointment calendar, and check it 2-3 times a day to keep on track. Keep it with you when you leave the house. Create spaces where you keep certain things, and always put things back in their places after you use them. Keep all follow-up visits. Your health care provider will need to monitor your condition and adjust your treatment over time. Where to find support Talking to others  Keep emotion out of important discussions and speak in a calm, logical way. Listen closely and patiently to your loved ones. Try to understand their point of view, and try to avoid getting defensive. Take responsibility for the consequences of your actions. Ask that others do not take your behaviors personally. Aim to solve problems as they come up, and express your feelings instead of bottling them up. Talk openly about what you need from your loved ones and how they can support you. Consider going to family therapy sessions or having your family meet with a specialist who deals with ADHD-related behavior problems. Finances Not all insurance plans cover mental health care, so it is important to check with your insurance carrier. If paying for co-pays or counseling services is a problem, search for a local or county mental health care center. Public  mental health care services may be offered there at a low cost or no cost when you are not able to see a private health care provider. If you are taking medicine for ADHD, you may be able to get the generic  form, which may be less expensive than brand-name medicine. Some makers of prescription medicines also offer help to patients who cannot afford the medicines that they need. Therapy and support groups Talking with a mental health care provider and participating in support groups can help to improve your quality of life, daily functioning, and overall symptoms. Questions to ask your health care provider: What are the risks and benefits of taking medicines? Would I benefit from therapy? How often should I follow up with a health care provider? Where to find more information Learn more about ADHD from: Children and Adults with Attention Deficit Hyperactivity Disorder: [employer] of Mental Health: https://www.frey.org/ Centers for Disease Control and Prevention: StoreMirror.com.cy Contact a health care provider if: You have side effects from your medicines, such as: Repeated muscle twitches, coughing, or speech outbursts. Sleep problems. Loss of appetite. Dizziness. Unusually fast heartbeat. Stomach pains. Headaches. You have new or worsening behavior problems. You are struggling with anxiety, depression, or substance abuse. Get help right away if: You have a severe reaction to a medicine. These symptoms may be an emergency. Get help right away. Call 911. Do not wait to see if the symptoms will go away. Do not drive yourself to the hospital. Take one of these steps if you feel like you may hurt yourself or others, or have thoughts about taking your own life: Go to your nearest emergency room. Call 911. Call the [place] at [phone] or [phone]. This is open 24 hours a day. Text the Crisis Text Line at (903) 807-3095. Summary With treatment and support, you can live with ADHD and manage your symptoms. Consider taking part in family therapy or self-help groups with family members or friends. When you talk with friends and family about your ADHD, be patient and  communicate openly. Keep all follow-up visits. Your health care provider will need to monitor your condition and adjust your treatment over time.  ++++++++++++++++++++++++++++++  Supporting Someone With Attention Deficit Hyperactivity Disorder  Attention deficit hyperactivity disorder (ADHD) is a mental health disorder that starts during childhood. For most people with ADHD, the condition continues into the adult years. Treatment can help you manage your symptoms. ADHD is a common cause of behavioral and learning problems among children. ADHD is a long-term (chronic) condition. If this disorder is not treated, it can have serious effects into adolescence and adulthood. Managing lifestyle changes can be challenging. Seeking support from family and friends can be helpful. How does this condition affect my loved one? ADHD can affect daily functioning in ways that often cause problems for the person with ADHD and their friends and family members. A child with ADHD may: Have a poor attention span. This means that the child can only stay focused or interested in something for a short time and may get distracted easily. Have trouble listening to instructions. Have problems with organization. Often make simple mistakes. Forget things and lose things often. Talk out of turn, or interrupt others. Move constantly, fidget, and have difficulty sitting still. An adult with ADHD may: Get distracted easily. Be disorganized at home and work. Miss, forget, or be late for appointments. Have trouble with details and completing tasks. Be irritable and impatient. Get bored easily during meetings.  Have great difficulty concentrating. What do I need to know about the treatment options? Treatment for this condition usually involves: Behavioral treatment. Working with a Transport planner or mental health care provider, the person with ADHD may: Set rewards for desired behavior. Set small goals and clear expectations,  and be held accountable for meeting them. Get help with planning and timing activities. Become more patient and more mindful of the condition. Medicines, such as: Stimulant or non-stimulant medicines that help a person to: Control their behavior (decrease impulsivity). Control their extra physical activity (decrease hyperactivity). Increase the ability to pay attention. Antidepressants for depression or anxiety Structured classroom management for children at school through structured activities during and after school, support from teachers, and special education specialists. Teaching parents how to use techniques at home to help manage their child's symptoms and behavior. These include rewarding good behavior, providing regular schedules, and setting limits. What actions can I take to manage the condition? Talk about the condition Pick a time to talk with your loved one when distractions and interruptions are unlikely. Let your loved one know that they are capable of success. Focus on your loved one's strengths, and try not to let your loved one use ADHD as an excuse for undesirable behavior. Let your loved one know that there are well-known, successful people who also have ADHD. This may be encouraging to your loved one. Give your loved one time to process their thoughts and to ask questions. Children with ADHD may benefit from hearing more about how their treatment plan will help them. This may help them focus on goal behaviors. Find support and resources A health care provider may be able to recommend resources that are available online or over the phone. You could start with: Attention Deficit Disorder Association (ADDA): [employer].com.cy National Institute of Mental Health [facility]): https://www.frey.org/ Training classes or conferences that help parents of children with ADHD to support their children and cope with the disorder. Support groups for families who are affected by ADHD. General support If you  are a parent of a child with ADHD, you can take the following actions to support your child's education: Talk to teachers about the ways that your child learns best. Be your child's advocate and stay in touch with their school about all problems related to ADHD. At the end of the summer, make appointments to talk with teachers and other school staff before the new school year begins. Listen to teachers carefully, and share your child's history with them. Create a behavior plan that your child, your family, and the teachers can agree on. Write down goals to help your child succeed. What are some signs that the condition is getting worse? Signs that your loved one's condition may be getting worse include: Increased trouble completing tasks and paying attention. Hyperactivity and impulsivity. Problems with relationships. Impatience, restlessness, and mood swings. Worsening problems at work or school. How to manage stress It is important to find ways to care for your body, mind, and well-being while supporting someone with ADHD. Spend time with friends and family. Find someone you can talk to who will also help you work on using coping skills to manage stress. Understand what your limits are. Say "no" to requests or events that lead to a schedule that is too busy. Make time for activities that help you relax, and try not to feel guilty about taking time for yourself. Consider trying meditation and deep breathing exercises to lower your stress. Get plenty of sleep. Exercise, even  if it is just taking a short walk a few times a week. If you are a parent of a child with ADHD, arrange for child care so you can take breaks once in a while. Contact a health care provider if: Your loved one's symptoms get worse. Your loved one shows signs of depression, anxiety, or another mental health condition. Your child has behavioral problems at school. Get help right away if: Get help right away if you feel  like your loved one may hurt themselves or others, or if they have thoughts about taking their own life. Go to your nearest emergency room or: Call 911. Call the [place] at [phone] or [phone]. This is open 24 hours a day. Text the Crisis Text Line at (301)506-6116. Summary Attention deficit hyperactivity disorder (ADHD) is a long-term (chronic) condition that can affect daily functioning in ways that often cause problems for the person with ADHD and their loved ones. This disorder can be treated effectively with medicine, behavioral treatment, and techniques to manage symptoms and behaviors. Many organizations and groups are available to help families to manage ADHD. It is important to find ways to care for your own body, mind, and well-being while supporting someone with ADHD. Make time for activities that help you relax.   ++++++++++++++++++++++++++++++  Vit D  & Vit C 1,000 mg   are recommended to help protect  against the Covid-19 and other Corona viruses.    Also it's recommended  to take  Zinc 50 mg  to help  protect against the Covid-19   and best place to get  is also on [employer].com  and don't pay more than 6-8 cents /pill !  ================================ Coronavirus (COVID-19) Are you at risk?  Are you at risk for the Coronavirus (COVID-19)?  To be considered HIGH RISK for Coronavirus (COVID-19), you have to meet the following criteria:  Traveled to [place], [place], [place], [place] or [place]; or in the [place] to [place], [place], [place]  or [place]; and have fever, cough, and shortness of breath within the last 2 weeks of travel OR Been in close contact with a person diagnosed with COVID-19 within the last 2 weeks and have  fever, cough,and shortness of breath  IF YOU DO NOT MEET THESE CRITERIA, YOU ARE CONSIDERED LOW RISK FOR COVID-19.  What to do if you are HIGH RISK for COVID-19?  If you are having a medical emergency,  call 911. Seek medical care right away. Before you go to a doctor's office, urgent care or emergency department,  call ahead and tell them about your recent travel, contact with someone diagnosed with COVID-19   and your symptoms.  You should receive instructions from your physician's office regarding next steps of care.  When you arrive at healthcare provider, tell the healthcare staff immediately you have returned from  visiting [place], [place], [place], [place] or [place]; or traveled in the [place] to [place], [place],  [place] or [place] in the last two weeks or you have been in close contact with a person diagnosed with  COVID-19 in the last 2 weeks.   Tell the health care staff about your symptoms: fever, cough and shortness of breath. After you have been seen by a medical provider, you will be either: Tested for (COVID-19) and discharged home on quarantine except to seek medical care if  symptoms worsen, and asked to  Stay home and avoid contact with others until  you get your results (4-5 days)  Avoid travel on public transportation if possible (such as bus, train, or airplane) or Sent to the Emergency Department by EMS for evaluation, COVID-19 testing  and  possible admission depending on your condition and test results.  What to do if you are LOW RISK for COVID-19?  Reduce your risk of any infection by using the same precautions used for avoiding the common cold or flu:  Wash your hands often with soap and warm water for at least 20 seconds.  If soap and water are not readily available,  use an alcohol-based hand sanitizer with at least 60% alcohol.  If coughing or sneezing, cover your mouth and nose by coughing or sneezing into the elbow areas of your shirt or coat,  into a tissue or into your sleeve (not your hands). Avoid shaking hands with others and consider head nods or verbal greetings only. Avoid touching your eyes, nose, or mouth with unwashed hands.  Avoid  close contact with people who are sick. Avoid places or events with large numbers of people in one location, like concerts or sporting events. Carefully consider travel plans you have or are making. If you are planning any travel outside or inside the [place], visit the CDC's Travelers' Health webpage for the latest health notices. If you have some symptoms but not all symptoms, continue to monitor at home and seek medical attention  if your symptoms worsen. If you are having a medical emergency, call 911. >>>>>>>>>>>>>>>>>>>>>>> Preventive Care for Adults  A healthy lifestyle and preventive care can promote health and wellness. Preventive health guidelines for women include the following key practices. A routine yearly physical is a good way to check with your health care provider about your health and preventive screening. It is a chance to share any concerns and updates on your health and to receive a thorough exam. Visit your dentist for a routine exam and preventive care every 6 months. Brush your teeth twice a day and floss once a day. Good oral hygiene prevents tooth decay and gum disease. The frequency of eye exams is based on your age, health, family medical history, use of contact lenses, and other factors. Follow your health care provider's recommendations for frequency of eye exams. Eat a healthy diet. Foods like vegetables, fruits, whole grains, low-fat dairy products, and lean protein foods contain the nutrients you need without too many calories. Decrease your intake of foods high in solid fats, added sugars, and salt. Eat the right amount of calories for you. Get information about a proper diet from your health care provider, if necessary. Regular physical exercise is one of the most important things you can do for your health. Most adults should get at least 150 minutes of moderate-intensity exercise (any activity that increases your heart rate and causes you to sweat) each week. In  addition, most adults need muscle-strengthening exercises on 2 or more days a week. Maintain a healthy weight. The body mass index (BMI) is a screening tool to identify possible weight problems. It provides an estimate of body fat based on height and weight. Your health care provider can find your BMI and can help you achieve or maintain a healthy weight. For adults 20 years and older: A BMI below 18.5 is considered underweight. A BMI of 18.5 to 24.9 is normal. A BMI of 25 to 29.9 is considered overweight. A BMI of 30 and above is considered obese. Maintain normal blood lipids and cholesterol levels by  exercising and minimizing your intake of saturated fat. Eat a balanced diet with plenty of fruit and vegetables. Blood tests for lipids and cholesterol should begin at age 27 and be repeated every 5 years. If your lipid or cholesterol levels are high, you are over 50, or you are at high risk for heart disease, you may need your cholesterol levels checked more frequently. Ongoing high lipid and cholesterol levels should be treated with medicines if diet and exercise are not working. If you smoke, find out from your health care provider how to quit. If you do not use tobacco, do not start. Lung cancer screening is recommended for adults aged 61-80 years who are at high risk for developing lung cancer because of a history of smoking. A yearly low-dose CT scan of the lungs is recommended for people who have at least a 30-pack-year history of smoking and are a current smoker or have quit within the past 15 years. A pack year of smoking is smoking an average of 1 pack of cigarettes a day for 1 year (for example: 1 pack a day for 30 years or 2 packs a day for 15 years). Yearly screening should continue until the smoker has stopped smoking for at least 15 years. Yearly screening should be stopped for people who develop a health problem that would prevent them from having lung cancer treatment. High blood pressure  causes heart disease and increases the risk of stroke. Your blood pressure should be checked at least every 1 to 2 years. Ongoing high blood pressure should be treated with medicines if weight loss and exercise do not work. If you are 82-70 years old, ask your health care provider if you should take aspirin to prevent strokes. Diabetes screening involves taking a blood sample to check your fasting blood sugar level. This should be done once every 3 years, after age 74, if you are within normal weight and without risk factors for diabetes. Testing should be considered at a younger age or be carried out more frequently if you are overweight and have at least 1 risk factor for diabetes. Breast cancer screening is essential preventive care for women. You should practice "breast self-awareness." This means understanding the normal appearance and feel of your breasts and may include breast self-examination. Any changes detected, no matter how small, should be reported to a health care provider. Women in their 61s and 30s should have a clinical breast exam (CBE) by a health care provider as part of a regular health exam every 1 to 3 years. After age 66, women should have a CBE every year. Starting at age 40, women should consider having a mammogram (breast X-ray test) every year. Women who have a family history of breast cancer should talk to their health care provider about genetic screening. Women at a high risk of breast cancer should talk to their health care providers about having an MRI and a mammogram every year. Breast cancer gene (BRCA)-related cancer risk assessment is recommended for women who have family members with BRCA-related cancers. BRCA-related cancers include breast, ovarian, tubal, and peritoneal cancers. Having family members with these cancers may be associated with an increased risk for harmful changes (mutations) in the breast cancer genes BRCA1 and BRCA2. Results of the assessment will  determine the need for genetic counseling and BRCA1 and BRCA2 testing. Routine pelvic exams to screen for cancer are no longer recommended for nonpregnant women who are considered low risk for cancer of the pelvic organs (ovaries,  uterus, and vagina) and who do not have symptoms. Ask your health care provider if a screening pelvic exam is right for you. If you have had past treatment for cervical cancer or a condition that could lead to cancer, you need Pap tests and screening for cancer for at least 20 years after your treatment. If Pap tests have been discontinued, your risk factors (such as having a new sexual partner) need to be reassessed to determine if screening should be resumed. Some women have medical problems that increase the chance of getting cervical cancer. In these cases, your health care provider may recommend more frequent screening and Pap tests. Colorectal cancer can be detected and often prevented. Most routine colorectal cancer screening begins at the age of 46 years and continues through age 33 years. However, your health care provider may recommend screening at an earlier age if you have risk factors for colon cancer. On a yearly basis, your health care provider may provide home test kits to check for hidden blood in the stool. Use of a small camera at the end of a tube, to directly examine the colon (sigmoidoscopy or colonoscopy), can detect the earliest forms of colorectal cancer. Talk to your health care provider about this at age 48, when routine screening begins.  Direct exam of the colon should be repeated every 5-10 years through age 92 years, unless early forms of pre-cancerous polyps or small growths are found. Hepatitis C blood testing is recommended for all people born from [birth year] through 1963 and any individual with known risks for hepatitis C.  Osteoporosis is a disease in which the bones lose minerals and strength with aging. This can result in serious bone fractures or  breaks. The risk of osteoporosis can be identified using a bone density scan. Women ages 64 years and over and women at risk for fractures or osteoporosis should discuss screening with their health care providers. Ask your health care provider whether you should take a calcium supplement or vitamin D to reduce the rate of osteoporosis. Menopause can be associated with physical symptoms and risks. Hormone replacement therapy is available to decrease symptoms and risks. You should talk to your health care provider about whether hormone replacement therapy is right for you. Use sunscreen. Apply sunscreen liberally and repeatedly throughout the day. You should seek shade when your shadow is shorter than you. Protect yourself by wearing long sleeves, pants, a wide-brimmed hat, and sunglasses year round, whenever you are outdoors. Once a month, do a whole body skin exam, using a mirror to look at the skin on your back. Tell your health care provider of new moles, moles that have irregular borders, moles that are larger than a pencil eraser, or moles that have changed in shape or color. Stay current with required vaccines (immunizations). Influenza vaccine. All adults should be immunized every year. Tetanus, diphtheria, and acellular pertussis (Td, Tdap) vaccine. Pregnant women should receive 1 dose of Tdap vaccine during each pregnancy. The dose should be obtained regardless of the length of time since the last dose. Immunization is preferred during the 27th-36th week of gestation. An adult who has not previously received Tdap or who does not know her vaccine status should receive 1 dose of Tdap. This initial dose should be followed by tetanus and diphtheria toxoids (Td) booster doses every 10 years. Adults with an unknown or incomplete history of completing a 3-dose immunization series with Td-containing vaccines should begin or complete a primary immunization series including a Tdap  dose. Adults should receive a  Td booster every 10 years. Varicella vaccine. An adult without evidence of immunity to varicella should receive 2 doses or a second dose if she has previously received 1 dose. Pregnant females who do not have evidence of immunity should receive the first dose after pregnancy. This first dose should be obtained before leaving the health care facility. The second dose should be obtained 4-8 weeks after the first dose. Human papillomavirus (HPV) vaccine. Females aged 13-26 years who have not received the vaccine previously should obtain the 3-dose series. The vaccine is not recommended for use in pregnant females. However, pregnancy testing is not needed before receiving a dose. If a female is found to be pregnant after receiving a dose, no treatment is needed. In that case, the remaining doses should be delayed until after the pregnancy. Immunization is recommended for any person with an immunocompromised condition through the age of 21 years if she did not get any or all doses earlier. During the 3-dose series, the second dose should be obtained 4-8 weeks after the first dose. The third dose should be obtained 24 weeks after the first dose and 16 weeks after the second dose. Zoster vaccine. One dose is recommended for adults aged 50 years or older unless certain conditions are present. Measles, mumps, and rubella (MMR) vaccine. Adults born before [birth year] generally are considered immune to measles and mumps. Adults born in [birth year] or later should have 1 or more doses of MMR vaccine unless there is a contraindication to the vaccine or there is laboratory evidence of immunity to each of the three diseases. A routine second dose of MMR vaccine should be obtained at least 28 days after the first dose for students attending postsecondary schools, health care workers, or international travelers. People who received inactivated measles vaccine or an unknown type of measles vaccine during 1963-1967 should receive 2 doses of MMR  vaccine. People who received inactivated mumps vaccine or an unknown type of mumps vaccine before 1979 and are at high risk for mumps infection should consider immunization with 2 doses of MMR vaccine. For females of childbearing age, rubella immunity should be determined. If there is no evidence of immunity, females who are not pregnant should be vaccinated. If there is no evidence of immunity, females who are pregnant should delay immunization until after pregnancy. Unvaccinated health care workers born before [birth year] who lack laboratory evidence of measles, mumps, or rubella immunity or laboratory confirmation of disease should consider measles and mumps immunization with 2 doses of MMR vaccine or rubella immunization with 1 dose of MMR vaccine. Pneumococcal 13-valent conjugate (PCV13) vaccine. When indicated, a person who is uncertain of her immunization history and has no record of immunization should receive the PCV13 vaccine. An adult aged 32 years or older who has certain medical conditions and has not been previously immunized should receive 1 dose of PCV13 vaccine. This PCV13 should be followed with a dose of pneumococcal polysaccharide (PPSV23) vaccine. The PPSV23 vaccine dose should be obtained at least 1 or more year(s) after the dose of PCV13 vaccine. An adult aged 19 years or older who has certain medical conditions and previously received 1 or more doses of PPSV23 vaccine should receive 1 dose of PCV13. The PCV13 vaccine dose should be obtained 1 or more years after the last PPSV23 vaccine dose.  Pneumococcal polysaccharide (PPSV23) vaccine. When PCV13 is also indicated, PCV13 should be obtained first. All adults aged 21 years and older should be  immunized. An adult younger than age 58 years who has certain medical conditions should be immunized. Any person who resides in a nursing home or long-term care facility should be immunized. An adult smoker should be immunized. People with an  immunocompromised condition and certain other conditions should receive both PCV13 and PPSV23 vaccines. People with human immunodeficiency virus (HIV) infection should be immunized as soon as possible after diagnosis. Immunization during chemotherapy or radiation therapy should be avoided. Routine use of PPSV23 vaccine is not recommended for American Indians, [place] Natives, or people younger than 65 years unless there are medical conditions that require PPSV23 vaccine. When indicated, people who have unknown immunization and have no record of immunization should receive PPSV23 vaccine. One-time revaccination 5 years after the first dose of PPSV23 is recommended for people aged 19-64 years who have chronic kidney failure, nephrotic syndrome, asplenia, or immunocompromised conditions. People who received 1-2 doses of PPSV23 before age 53 years should receive another dose of PPSV23 vaccine at age 68 years or later if at least 5 years have passed since the previous dose. Doses of PPSV23 are not needed for people immunized with PPSV23 at or after age 12 years.  Preventive Services / Frequency  Ages 78 to 42 years Blood pressure check. Lipid and cholesterol check. Lung cancer screening. / Every year if you are aged 65-80 years and have a 30-pack-year history of smoking and currently smoke or have quit within the past 15 years. Yearly screening is stopped once you have quit smoking for at least 15 years or develop a health problem that would prevent you from having lung cancer treatment. Clinical breast exam.** / Every year after age 63 years.  BRCA-related cancer risk assessment.** / For women who have family members with a BRCA-related cancer (breast, ovarian, tubal, or peritoneal cancers). Mammogram.** / Every year beginning at age 75 years and continuing for as long as you are in good health. Consult with your health care provider. Pap test.** / Every 3 years starting at age 96 years through age 82 or 11  years with a history of 3 consecutive normal Pap tests. HPV screening.** / Every 3 years from ages 47 years through ages 22 to 51 years with a history of 3 consecutive normal Pap tests. Fecal occult blood test (FOBT) of stool. / Every year beginning at age 44 years and continuing until age 55 years. You may not need to do this test if you get a colonoscopy every 10 years. Flexible sigmoidoscopy or colonoscopy.** / Every 5 years for a flexible sigmoidoscopy or every 10 years for a colonoscopy beginning at age 43 years and continuing until age 38 years. Hepatitis C blood test.** / For all people born from [birth year] through 1963 and any individual with known risks for hepatitis C. Skin self-exam. / Monthly. Influenza vaccine. / Every year. Tetanus, diphtheria, and acellular pertussis (Tdap/Td) vaccine.** / Consult your health care provider. Pregnant women should receive 1 dose of Tdap vaccine during each pregnancy. 1 dose of Td every 10 years. Varicella vaccine.** / Consult your health care provider. Pregnant females who do not have evidence of immunity should receive the first dose after pregnancy. Zoster vaccine.** / 1 dose for adults aged 79 years or older. Pneumococcal 13-valent conjugate (PCV13) vaccine.** / Consult your health care provider. Pneumococcal polysaccharide (PPSV23) vaccine.** / 1 to 2 doses if you smoke cigarettes or if you have certain conditions. Meningococcal vaccine.** / Consult your health care provider. Hepatitis A vaccine.** / Consult your  health care provider. Hepatitis B vaccine.** / Consult your health care provider. Screening for abdominal aortic aneurysm (AAA)  by ultrasound is recommended for people over 50 who have history of high blood pressure or who are current or former smokers. ++++++++++++++++++ Recommend Adult Low Dose Aspirin or  coated  Aspirin 81 mg daily  To reduce risk of Colon Cancer 40 %,  Skin Cancer 26 % ,  Melanoma 46%  and  Pancreatic cancer  60% +++++++++++++++++++ Vitamin D goal  is between 70-100.  Please make sure that you are taking your Vitamin D as directed.  It is very important as a natural anti-inflammatory  helping hair, skin, and nails, as well as reducing stroke and heart attack risk.  It helps your bones and helps with mood. It also decreases numerous cancer risks so please take it as directed.  Low Vit D is associated with a 200-300% higher risk for CANCER  and 200-300% higher risk for HEART   ATTACK  &  STROKE.   .....................................Marland Kitchen It is also associated with higher death rate at younger ages,  autoimmune diseases like Rheumatoid arthritis, Lupus, Multiple Sclerosis.    Also many other serious conditions, like depression, Alzheimer's Dementia, infertility, muscle aches, fatigue, fibromyalgia - just to name a few. ++++++++++++++++++ Recommend the book "The END of DIETING" by Dr Excell Seltzer  & the book "The END of DIABETES " by Dr Excell Seltzer At [email or web address] - get book & Audio CD's    Being diabetic has a  300% increased risk for heart attack, stroke, cancer, and alzheimer- type vascular dementia. It is very important that you work harder with diet by avoiding all foods that are white. Avoid white rice (brown & wild rice is OK), white potatoes (sweetpotatoes in moderation is OK), White bread or wheat bread or anything made out of white flour like bagels, donuts, rolls, buns, biscuits, cakes, pastries, cookies, pizza crust, and pasta (made from white flour & egg whites) - vegetarian pasta or spinach or wheat pasta is OK. Multigrain breads like Arnold's or [employer], or multigrain sandwich thins or flatbreads.  Diet, exercise and weight loss can reverse and cure diabetes in the early stages.  Diet, exercise and weight loss is very important in the control and prevention of complications of diabetes which affects every system in your body, ie. Brain - dementia/stroke, eyes - glaucoma/blindness,  heart - heart attack/heart failure, kidneys - dialysis, stomach - gastric paralysis, intestines - malabsorption, nerves - severe painful neuritis, circulation - gangrene & loss of a leg(s), and finally cancer and Alzheimers.    I recommend avoid fried & greasy foods,  sweets/candy, white rice (brown or wild rice or Quinoa is OK), white potatoes (sweet potatoes are OK) - anything made from white flour - bagels, doughnuts, rolls, buns, biscuits,white and wheat breads, pizza crust and traditional pasta made of white flour & egg white(vegetarian pasta or spinach or wheat pasta is OK).  Multi-grain bread is OK - like multi-grain flat bread or sandwich thins. Avoid alcohol in excess. Exercise is also important.    Eat all the vegetables you want - avoid meat, especially red meat and dairy - especially cheese.  Cheese is the most concentrated form of trans-fats which is the worst thing to clog up our arteries. Veggie cheese is OK which can be found in the fresh produce section at [employer] or [employer] or [employer]  ++++++++++++++++++++++ DASH Eating Plan  DASH stands for "Dietary Approaches to Stop Hypertension."  The DASH eating plan is a healthy eating plan that has been shown to reduce high blood pressure (hypertension). Additional health benefits may include reducing the risk of type 2 diabetes mellitus, heart disease, and stroke. The DASH eating plan may also help with weight loss. WHAT DO I NEED TO KNOW ABOUT THE DASH EATING PLAN? For the DASH eating plan, you will follow these general guidelines: Choose foods with a percent daily value for sodium of less than 5% (as listed on the food label). Use salt-free seasonings or herbs instead of table salt or sea salt. Check with your health care provider or pharmacist before using salt substitutes. Eat lower-sodium products, often labeled as "lower sodium" or "no salt added." Eat fresh foods. Eat more vegetables, fruits, and low-fat dairy  products. Choose whole grains. Look for the word "whole" as the first word in the ingredient list. Choose fish  Limit sweets, desserts, sugars, and sugary drinks. Choose heart-healthy fats. Eat veggie cheese  Eat more home-cooked food and less restaurant, buffet, and fast food. Limit fried foods. Cook foods using methods other than frying. Limit canned vegetables. If you do use them, rinse them well to decrease the sodium. When eating at a restaurant, ask that your food be prepared with less salt, or no salt if possible.                      WHAT FOODS CAN I EAT? Read Dr Fara Olden Fuhrman's books on The End of Dieting & The End of Diabetes  Grains Whole grain or whole wheat bread. Brown rice. Whole grain or whole wheat pasta. Quinoa, bulgur, and whole grain cereals. Low-sodium cereals. Corn or whole wheat flour tortillas. Whole grain cornbread. Whole grain crackers. Low-sodium crackers.  Vegetables Fresh or frozen vegetables (raw, steamed, roasted, or grilled). Low-sodium or reduced-sodium tomato and vegetable juices. Low-sodium or reduced-sodium tomato sauce and paste. Low-sodium or reduced-sodium canned vegetables.   Fruits All fresh, canned (in natural juice), or frozen fruits.  Protein Products  All fish and seafood.  Dried beans, peas, or lentils. Unsalted nuts and seeds. Unsalted canned beans.  Dairy Low-fat dairy products, such as skim or 1% milk, 2% or reduced-fat cheeses, low-fat ricotta or cottage cheese, or plain low-fat yogurt. Low-sodium or reduced-sodium cheeses.  Fats and Oils Tub margarines without trans fats. Light or reduced-fat mayonnaise and salad dressings (reduced sodium). Avocado. Safflower, olive, or canola oils. Natural peanut or almond butter.  Other Unsalted popcorn and pretzels. The items listed above may not be a complete list of recommended foods or beverages. Contact your dietitian for more options.  ++++++++++++++++++  WHAT FOODS ARE NOT  RECOMMENDED? Grains/ White flour or wheat flour White bread. White pasta. White rice. Refined cornbread. Bagels and croissants. Crackers that contain trans fat.  Vegetables  Creamed or fried vegetables. Vegetables in a . Regular canned vegetables. Regular canned tomato sauce and paste. Regular tomato and vegetable juices.  Fruits Dried fruits. Canned fruit in light or heavy syrup. Fruit juice.  Meat and Other Protein Products Meat in general - RED meat & White meat.  Fatty cuts of meat. Ribs, chicken wings, all processed meats as bacon, sausage, bologna, salami, fatback, hot dogs, bratwurst and packaged luncheon meats.  Dairy Whole or 2% milk, cream, half-and-half, and cream cheese. Whole-fat or sweetened yogurt. Full-fat cheeses or blue cheese. Non-dairy creamers and whipped toppings. Processed cheese, cheese spreads, or cheese curds.  Condiments Onion and garlic salt, seasoned salt, table salt, and  sea salt. Canned and packaged gravies. Worcestershire sauce. Tartar sauce. Barbecue sauce. Teriyaki sauce. Soy sauce, including reduced sodium. Steak sauce. Fish sauce. Oyster sauce. Cocktail sauce. Horseradish. Ketchup and mustard. Meat flavorings and tenderizers. Bouillon cubes. Hot sauce. Tabasco sauce. Marinades. Taco seasonings. Relishes.  Fats and Oils Butter, stick margarine, lard, shortening and bacon fat. Coconut, palm kernel, or palm oils. Regular salad dressings.  Pickles and olives. Salted popcorn and pretzels.  The items listed above may not be a complete list of foods and beverages to avoid.

## 2022-03-12 NOTE — Progress Notes (Unsigned)
Annual Screening/Preventative Visit & Comprehensive Evaluation &  Examination   Future Appointments  Date Time Provider Department  03/10/2022 11:00 AM Unk Pinto, MD [facility]  03/17/2023 11:00 AM Unk Pinto, MD [facility]        This very nice 47 y.o.  MWF presents for a Screening /Preventative Visit & comprehensive evaluation and management of multiple medical co-morbidities.  Patient has been followed for  BP Screening,  Hx/o abn glucose, ADD & Vitamin D Deficiency, Pre-Diabetes, hx/o Asthma and Vitamin D Deficiency.        Patient has been prescribed thyroid extract by another provider.        Patient has hx/o ADD being  treated with Bupropion. Treatment in the past with Ritalin improved focus, concentration in time management with work tasks. More recently she had been seen by Dr Vivia Ewing and was treated  with Amphetamine 10 mg salt and taking only 1/4 tablet , she had favorable response, has since stopped the Amphetamine.         Patient is followed expectantly for elevated BP  & BP 's have been controlled. Patient denies any cardiac symptoms as chest pain, palpitations, shortness of breath, dizziness or ankle swelling. Today's BP is at goal at  120/60 .        Patient's lipids  are controlled with diet. Last lipids were at goal :  Lab Results  Component Value Date   CHOL 144 02/28/2021   HDL 60 02/28/2021   LDLCALC 69 02/28/2021   TRIG 72 02/28/2021   CHOLHDL 2.4 02/28/2021         Patient is monitored for glucose intolerance  and patient denies reactive hypoglycemic symptoms, visual blurring, diabetic polys or paresthesias. Recent A1c was at goal :  Lab Results  Component Value Date   HGBA1C 4.9 02/28/2021         Finally, patient has history of Vitamin D Deficiency ("36" /May 2022) and last Vitamin D was at goal ( betw 70-100 ) :  Lab Results  Component Value Date   VD25OH 76 02/28/2021     Current Outpatient Medications on File  Prior to Visit  Medication Sig    buPROPion XL 300 MG  Take 1 tablet  Daily for Mood, Focus & Concentration   TRELEGY ELLIPTA 100-62.5-25 A Inhale 1 puff  daily.    Multiple Vitamins-Minerals  Take 1 tablet daily.   Omega-3 FISH OIL  Take 1 capsule daily.   PROAIR HFA  inhaler 1 to 2 inhalations 1 every 4 hours as needed    Thyroid 48.75 MG TABS Take 1 tablet  2 times daily.     Allergies  Allergen Reactions   Penicillins REACTION: hives     Past Medical History:  Diagnosis Date   Allergy    Asthma    Thyroid disease      Health Maintenance  Topic Date Due   COVID-19 Vaccine (1) Never done   HIV Screening  Never done   Hepatitis C Screening  Never done   TETANUS/TDAP  Never done   PAP SMEAR-Modifier  Never done   INFLUENZA VACCINE  Never done   HPV VACCINES  Aged Out     Last MGM - Diagnostic & U/S pending 03/24/2021    No past surgical history on file.   Family History  Problem Relation Age of Onset   Hypertension Mother    Cancer , prostate Father  Autism Brother      Social History   Tobacco Use   Smoking status: Never   Smokeless tobacco: Never  Substance Use Topics   Alcohol use: Yes   Drug use: No      ROS Constitutional: Denies fever, chills, weight loss/gain, headaches, insomnia,  night sweats, and change in appetite. Does c/o fatigue. Eyes: Denies redness, blurred vision, diplopia, discharge, itchy, watery eyes.  ENT: Denies discharge, congestion, post nasal drip, epistaxis, sore throat, earache, hearing loss, dental pain, Tinnitus, Vertigo, Sinus pain, snoring.  Cardio: Denies chest pain, palpitations, irregular heartbeat, syncope, dyspnea, diaphoresis, orthopnea, PND, claudication, edema Respiratory: denies cough, dyspnea, DOE, pleurisy, hoarseness, laryngitis, wheezing.  Gastrointestinal: Denies dysphagia, heartburn, reflux, water brash, pain, cramps, nausea, vomiting, bloating, diarrhea, constipation, hematemesis, melena,  hematochezia, jaundice, hemorrhoids Genitourinary: Denies dysuria, frequency, urgency, nocturia, hesitancy, discharge, hematuria, flank pain Breast: Breast lumps, nipple discharge, bleeding.  Musculoskeletal: Denies arthralgia, myalgia, stiffness, Jt. Swelling, pain, limp, and strain/sprain. Denies falls. Skin: Denies puritis, rash, hives, warts, acne, eczema, changing in skin lesion Neuro: No weakness, tremor, incoordination, spasms, paresthesia, pain Psychiatric: Denies confusion, memory loss, sensory loss. Denies Depression. Endocrine: Denies change in weight, skin, hair change, nocturia, and paresthesia, diabetic polys, visual blurring, hyper / hypo glycemic episodes.  Heme/Lymph: No excessive bleeding, bruising, enlarged lymph nodes.  Physical Exam  BP (!) 102/58   Pulse 90   Temp 97.9 F (36.6 C)   Resp 16   Ht '5\' 4"'$  (1.626 m)   Wt 109 lb 6.4 oz (49.6 kg)   SpO2 (!) 16%   BMI 18.78 kg/m   General Appearance: Well nourished, well groomed and in no apparent distress.  Eyes: PERRLA, EOMs, conjunctiva no swelling or erythema, normal fundi and vessels. Sinuses: No frontal/maxillary tenderness ENT/Mouth: EACs patent / TMs  nl. Nares clear without erythema, swelling, mucoid exudates. Oral hygiene is good. No erythema, swelling, or exudate. Tongue normal, non-obstructing. Tonsils not swollen or erythematous. Hearing normal.  Neck: Supple, thyroid not palpable. No bruits, nodes or JVD. Respiratory: Respiratory effort normal.  BS equal and clear bilateral without rales, rhonci, wheezing or stridor. Cardio: Heart sounds are normal with regular rate and rhythm and no murmurs, rubs or gallops. Peripheral pulses are normal and equal bilaterally without edema. No aortic or femoral bruits. Chest: symmetric with normal excursions and percussion. Breasts: Symmetric, without lumps, nipple discharge, retractions, or fibrocystic changes.  Abdomen: Flat, soft with bowel sounds active. Nontender, no  guarding, rebound, hernias, masses, or organomegaly.  Lymphatics: Non tender without lymphadenopathy.  Genitourinary:  Musculoskeletal: Full ROM all peripheral extremities, joint stability, 5/5 strength, and normal gait. Skin: Warm and dry without rashes, lesions, cyanosis, clubbing or  ecchymosis.  Neuro: Cranial nerves intact, reflexes equal bilaterally. Normal muscle tone, no cerebellar symptoms. Sensation intact.  Pysch: Alert and oriented X 3, normal affect, Insight and Judgment appropriate.    Assessment and Plan  1. Annual Preventative Screening Examination   2. Attention deficit disorder (ADD) without hyperactivity   3. Elevated BP without diagnosis of hypertension  - EKG 12-Lead - [place], RETROPERITNL ABD,  LTD - Urinalysis, Routine w reflex microscopic - Microalbumin / creatinine urine ratio - CBC with Differential/Platelet - COMPLETE METABOLIC PANEL WITH GFR - Magnesium - TSH  4. Lipid screening  - EKG 12-Lead - [place], RETROPERITNL ABD,  LTD - Lipid panel - TSH  5. Abnormal glucose  - EKG 12-Lead - [place], RETROPERITNL ABD,  LTD - Hemoglobin A1c - Insulin, random  6. Vitamin D  deficiency  - VITAMIN D 25 Hydroxy  7. Hypothyroidism  - TSH  8. Screening-pulmonary TB  - TB Skin Test  9. Screening for colorectal cancer  - Cologard pending  10. Screening for heart disease  - EKG 12-Lead  11. FH: hypertension  - EKG 12-Lead - [place], RETROPERITNL ABD,  LTD  12. Screening for AAA (aortic abdominal aneurysm)  - [place], RETROPERITNL ABD,  LTD  13. Fatigue, unspecified type  - Iron, Total/Total Iron Binding Cap - Vitamin B12 - CBC with Differential/Platelet - TSH  14. Medication management  - Urinalysis, Routine w reflex microscopic - Microalbumin / creatinine urine ratio - CBC with Differential/Platelet - COMPLETE METABOLIC PANEL WITH GFR - Magnesium - Lipid panel - TSH - Hemoglobin A1c - Insulin, random - VITAMIN D 25  Hydroxy           Patient was counseled in prudent diet to achieve/maintain BMI less than 25 for weight control, BP monitoring, regular exercise and medications. Discussed med's effects and SE's. Screening labs and tests done last week are reviewed with patient . Over 40 minutes of exam, counseling, chart review and high complex critical decision making was performed.   Kirtland Bouchard, MD

## 2022-03-13 ENCOUNTER — Encounter: Payer: Self-pay | Admitting: Internal Medicine

## 2022-03-13 ENCOUNTER — Ambulatory Visit (INDEPENDENT_AMBULATORY_CARE_PROVIDER_SITE_OTHER): Payer: BC Managed Care – PPO | Admitting: Internal Medicine

## 2022-03-13 VITALS — BP 120/60 | HR 79 | Temp 98.1°F | Resp 16 | Ht 64.0 in | Wt 111.4 lb

## 2022-03-13 DIAGNOSIS — Z79899 Other long term (current) drug therapy: Secondary | ICD-10-CM

## 2022-03-13 DIAGNOSIS — Z136 Encounter for screening for cardiovascular disorders: Secondary | ICD-10-CM

## 2022-03-13 DIAGNOSIS — R7309 Other abnormal glucose: Secondary | ICD-10-CM

## 2022-03-13 DIAGNOSIS — Z0000 Encounter for general adult medical examination without abnormal findings: Secondary | ICD-10-CM

## 2022-03-13 DIAGNOSIS — R030 Elevated blood-pressure reading, without diagnosis of hypertension: Secondary | ICD-10-CM | POA: Diagnosis not present

## 2022-03-13 DIAGNOSIS — F988 Other specified behavioral and emotional disorders with onset usually occurring in childhood and adolescence: Secondary | ICD-10-CM

## 2022-03-13 DIAGNOSIS — Z1211 Encounter for screening for malignant neoplasm of colon: Secondary | ICD-10-CM

## 2022-03-13 DIAGNOSIS — I700 Atherosclerosis of aorta: Secondary | ICD-10-CM | POA: Diagnosis not present

## 2022-03-13 DIAGNOSIS — Z130 Encounter for screening for diseases of the blood and blood-forming organs and certain disorders involving the immune mechanism: Secondary | ICD-10-CM

## 2022-03-13 DIAGNOSIS — Z1389 Encounter for screening for other disorder: Secondary | ICD-10-CM

## 2022-03-13 DIAGNOSIS — E559 Vitamin D deficiency, unspecified: Secondary | ICD-10-CM | POA: Diagnosis not present

## 2022-03-13 DIAGNOSIS — Z8249 Family history of ischemic heart disease and other diseases of the circulatory system: Secondary | ICD-10-CM

## 2022-03-13 DIAGNOSIS — Z13220 Encounter for screening for lipoid disorders: Secondary | ICD-10-CM

## 2022-03-13 DIAGNOSIS — Z111 Encounter for screening for respiratory tuberculosis: Secondary | ICD-10-CM

## 2022-03-13 DIAGNOSIS — Z131 Encounter for screening for diabetes mellitus: Secondary | ICD-10-CM | POA: Diagnosis not present

## 2022-03-13 DIAGNOSIS — Z0001 Encounter for general adult medical examination with abnormal findings: Secondary | ICD-10-CM

## 2022-03-13 DIAGNOSIS — E039 Hypothyroidism, unspecified: Secondary | ICD-10-CM

## 2022-03-13 DIAGNOSIS — R5383 Other fatigue: Secondary | ICD-10-CM

## 2022-03-14 NOTE — Progress Notes (Signed)
Please send lab letter also  <><><><><><><><><><><><><><><><><><><><><><><><><><><><><><><><><> <><><><><><><><><><><><><><><><><><><><><><><><><><><><><><><><><>  - Iron Levels & Vitamin B12 levels - Both Normal & OK  <><><><><><><><><><><><><><><><><><><><><><><><><><><><><><><><><>  - Total Chol = 145    & LDL Chol = 58    -     Both Excellent   !  <><><><><><><><><><><><><><><><><><><><><><><><><><><><><><><><><>  -  A1c - Normal - No Diabetes  ! <><><><><><><><><><><><><><><><><><><><><><><><><><><><><><><><><>  -  Vitamin D = 91 - Also Excellent  !       Please continue dose same  <><><><><><><><><><><><><><><><><><><><><><><><><><><><><><><><><>  -  All Else - CBC - Kidneys - Electrolytes - Liver - Magnesium & Thyroid    - all  Normal / OK <><><><><><><><><><><><><><><><><><><><><><><><><><><><><><><><><> <><><><><><><><><><><><><><><><><><><><><><><><><><><><><><><><><>  -  Keep up the Great Work  !  <><><><><><><><><><><><><><><><><><><><><><><><><><><><><><><><><> <><><><><><><><><><><><><><><><><><><><><><><><><><><><><><><><><>

## 2022-03-16 ENCOUNTER — Encounter: Payer: Self-pay | Admitting: Internal Medicine

## 2022-03-16 LAB — CBC WITH DIFFERENTIAL/PLATELET
Absolute Monocytes: 738 cells/uL (ref 200–950)
Basophils Absolute: 33 cells/uL (ref 0–200)
Basophils Relative: 0.4 %
Eosinophils Absolute: 82 cells/uL (ref 15–500)
Eosinophils Relative: 1 %
HCT: 38.3 % (ref 35.0–45.0)
Hemoglobin: 12.6 g/dL (ref 11.7–15.5)
Lymphs Abs: 1525 cells/uL (ref 850–3900)
MCH: 26.1 pg — ABNORMAL LOW (ref 27.0–33.0)
MCHC: 32.9 g/dL (ref 32.0–36.0)
MCV: 79.3 fL — ABNORMAL LOW (ref 80.0–100.0)
MPV: 12.5 fL (ref 7.5–12.5)
Monocytes Relative: 9 %
Neutro Abs: 5822 cells/uL (ref 1500–7800)
Neutrophils Relative %: 71 %
Platelets: 300 10*3/uL (ref 140–400)
RBC: 4.83 10*6/uL (ref 3.80–5.10)
RDW: 13 % (ref 11.0–15.0)
Total Lymphocyte: 18.6 %
WBC: 8.2 10*3/uL (ref 3.8–10.8)

## 2022-03-16 LAB — VITAMIN B12: Vitamin B-12: 626 pg/mL (ref 200–1100)

## 2022-03-16 LAB — LIPID PANEL
Cholesterol: 145 mg/dL (ref <200)
HDL: 68 mg/dL (ref >=50)
LDL Cholesterol (Calc): 58 mg/dL (calc)
Non-HDL Cholesterol (Calc): 77 mg/dL (calc) (ref <130)
Total CHOL/HDL Ratio: 2.1 (calc) (ref <5.0)
Triglycerides: 105 mg/dL (ref <150)

## 2022-03-16 LAB — COMPLETE METABOLIC PANEL WITH GFR
AG Ratio: 1.8 (calc) (ref 1.0–2.5)
ALT: 18 U/L (ref 6–29)
AST: 17 U/L (ref 10–35)
Albumin: 4.4 g/dL (ref 3.6–5.1)
Alkaline phosphatase (APISO): 39 U/L (ref 31–125)
BUN: 15 mg/dL (ref 7–25)
CO2: 28 mmol/L (ref 20–32)
Calcium: 9.1 mg/dL (ref 8.6–10.2)
Chloride: 104 mmol/L (ref 98–110)
Creat: 0.86 mg/dL (ref 0.50–0.99)
Globulin: 2.4 g/dL (calc) (ref 1.9–3.7)
Glucose, Bld: 75 mg/dL (ref 65–99)
Potassium: 4.1 mmol/L (ref 3.5–5.3)
Sodium: 139 mmol/L (ref 135–146)
Total Bilirubin: 0.3 mg/dL (ref 0.2–1.2)
Total Protein: 6.8 g/dL (ref 6.1–8.1)
eGFR: 85 mL/min/{1.73_m2} (ref >=60)

## 2022-03-16 LAB — TSH: TSH: 0.98 mIU/L

## 2022-03-16 LAB — INSULIN, RANDOM: Insulin: 4.6 u[IU]/mL

## 2022-03-16 LAB — URINALYSIS, ROUTINE W REFLEX MICROSCOPIC
Bilirubin Urine: NEGATIVE
Glucose, UA: NEGATIVE
Hgb urine dipstick: NEGATIVE
Ketones, ur: NEGATIVE
Leukocytes,Ua: NEGATIVE
Nitrite: NEGATIVE
Protein, ur: NEGATIVE
Specific Gravity, Urine: 1.013 (ref 1.001–1.035)
pH: 7 (ref 5.0–8.0)

## 2022-03-16 LAB — HEMOGLOBIN A1C
Hgb A1c MFr Bld: 5.1 % of total Hgb (ref <5.7)
Mean Plasma Glucose: 100 mg/dL
eAG (mmol/L): 5.5 mmol/L

## 2022-03-16 LAB — IRON, TOTAL/TOTAL IRON BINDING CAP
%SAT: 31 % (calc) (ref 16–45)
Iron: 104 ug/dL (ref 40–190)
TIBC: 339 mcg/dL (calc) (ref 250–450)

## 2022-03-16 LAB — MAGNESIUM: Magnesium: 2 mg/dL (ref 1.5–2.5)

## 2022-03-16 LAB — MICROALBUMIN / CREATININE URINE RATIO
Creatinine, Urine: 63 mg/dL (ref 20–275)
Microalb, Ur: <0.2 mg/dL

## 2022-03-16 LAB — VITAMIN D 25 HYDROXY (VIT D DEFICIENCY, FRACTURES): Vit D, 25-Hydroxy: 91 ng/mL (ref 30–100)

## 2022-03-20 DIAGNOSIS — Z23 Encounter for immunization: Secondary | ICD-10-CM | POA: Diagnosis not present

## 2022-04-11 LAB — COLOGUARD

## 2022-04-28 ENCOUNTER — Encounter: Payer: Self-pay | Admitting: *Deleted

## 2022-05-13 LAB — COLOGUARD

## 2022-06-11 ENCOUNTER — Ambulatory Visit
Admission: RE | Admit: 2022-06-11 | Discharge: 2022-06-11 | Disposition: A | Discharge status: Home / Self Care | Payer: BC Managed Care – PPO | Source: Ambulatory Visit | Attending: Sports Medicine | Admitting: Sports Medicine

## 2022-06-11 ENCOUNTER — Ambulatory Visit: Payer: BC Managed Care – PPO | Admitting: Sports Medicine

## 2022-06-11 VITALS — BP 118/78 | Ht 63.5 in | Wt 115.0 lb

## 2022-06-11 DIAGNOSIS — M79622 Pain in left upper arm: Secondary | ICD-10-CM | POA: Diagnosis not present

## 2022-06-11 DIAGNOSIS — M25522 Pain in left elbow: Secondary | ICD-10-CM

## 2022-06-11 DIAGNOSIS — G5622 Lesion of ulnar nerve, left upper limb: Secondary | ICD-10-CM

## 2022-06-11 DIAGNOSIS — Z1211 Encounter for screening for malignant neoplasm of colon: Secondary | ICD-10-CM | POA: Diagnosis not present

## 2022-06-11 MED ORDER — AMITRIPTYLINE HCL 25 MG PO TABS: 25.0000 mg | ORAL_TABLET | Freq: Every day | ORAL | 2 refills | Status: AC

## 2022-06-11 NOTE — Assessment & Plan Note (Signed)
I think she still has elements of an ulnar neuritis and pain into her left arm This may originally have been from a backpacking injury However I think we need to rule out whether she has any foraminal stenosis or changes in her neck Does not seem to be related to the shoulder Does not seem to be a thoracic outlet syndrome  We will get cervical spine films with obliques today Use vitamin B6 100 mg Amitriptyline 25 mg at night Reevaluate after approximately 1 month  Avoid positions that tend to trigger her symptoms

## 2022-06-11 NOTE — Progress Notes (Signed)
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  She had been in [place] in the past year and wore a backpack.  After returning from that she was having some tightness and occasionally some tingling in an ulnar distribution down her left arm. She denies any neck pain However she would occasionally have some pain in the periscapular muscles on the left, in the trapezius and sometimes when she would go to sleep at night her left arm would go to sleep She notices that reaching out to stabilize a client and Pilates may bother the symptoms She denies any real shoulder pain and had an x-ray and some manual work by Dr. Thereasa Distance and the x-ray was unremarkable  The hand does not go to sleep The hand does not get cold She does not feel any numbness in the hand  Physical exam Athletic white female in no acute distress BP 118/78   Ht 5' 3.5" (1.613 m)   Wt 115 lb (52.2 kg)   LMP 05/14/2022 (Exact Date)   BMI 20.05 kg/m   Shoulder: Inspection reveals no abnormalities, atrophy or asymmetry. Palpation is normal with no tenderness over AC joint or bicipital groove. ROM is full in all planes. Rotator cuff strength normal throughout. No signs of impingement with negative Neer and Hawkin's tests, empty can. Speeds and Yergason's tests normal. No labral pathology noted with negative Obrien's, negative clunk and good stability. Normal scapular function observed. No painful arc and no drop arm sign. No apprehension sign  Neck Full range of motion No pain with extension flexion rotation or lateral bend Spurling test is negative Neurotesting C5-T1 is strong  TOS testing I could not detect significant changes on a pump test or a Roos test Erb's point compression test did not trigger tingling into the arm

## 2022-06-11 NOTE — Patient Instructions (Signed)
B6 over-the-counter.... 100mg  daily  Get xrays done today

## 2022-06-17 LAB — COLOGUARD: COLOGUARD: NEGATIVE

## 2022-06-17 NOTE — Progress Notes (Signed)
-   Cologard =- Negative - Great !    - Repeat in 3 years

## 2022-07-24 DIAGNOSIS — F909 Attention-deficit hyperactivity disorder, unspecified type: Secondary | ICD-10-CM | POA: Diagnosis not present

## 2022-07-24 DIAGNOSIS — E039 Hypothyroidism, unspecified: Secondary | ICD-10-CM | POA: Diagnosis not present

## 2022-07-24 DIAGNOSIS — E559 Vitamin D deficiency, unspecified: Secondary | ICD-10-CM | POA: Diagnosis not present

## 2022-07-24 DIAGNOSIS — E063 Autoimmune thyroiditis: Secondary | ICD-10-CM | POA: Diagnosis not present

## 2022-08-28 ENCOUNTER — Other Ambulatory Visit: Payer: Self-pay | Admitting: Nurse Practitioner

## 2022-08-28 DIAGNOSIS — F988 Other specified behavioral and emotional disorders with onset usually occurring in childhood and adolescence: Secondary | ICD-10-CM

## 2022-12-03 ENCOUNTER — Ambulatory Visit: Payer: BC Managed Care – PPO | Admitting: Internal Medicine

## 2022-12-03 ENCOUNTER — Ambulatory Visit (INDEPENDENT_AMBULATORY_CARE_PROVIDER_SITE_OTHER): Payer: BC Managed Care – PPO | Admitting: Internal Medicine

## 2022-12-03 VITALS — BP 112/60 | HR 113 | Temp 97.5°F | Resp 16 | Ht 63.5 in | Wt 112.0 lb

## 2022-12-03 DIAGNOSIS — R300 Dysuria: Secondary | ICD-10-CM

## 2022-12-03 DIAGNOSIS — R1011 Right upper quadrant pain: Secondary | ICD-10-CM | POA: Diagnosis not present

## 2022-12-03 DIAGNOSIS — R1031 Right lower quadrant pain: Secondary | ICD-10-CM

## 2022-12-03 MED ORDER — NITROFURANTOIN MONOHYD MACRO 100 MG PO CAPS: 100.0000 mg | ORAL_CAPSULE | Freq: Two times a day (BID) | ORAL | 0 refills | Status: AC

## 2022-12-03 MED ORDER — DEXAMETHASONE 4 MG PO TABS: ORAL_TABLET | ORAL | 0 refills | Status: AC

## 2022-12-03 NOTE — Progress Notes (Unsigned)
  [facility]  Lucky Cowboy, M.D.        Rance Muir, D.NP      Adela Glimpse, D.NP  [facility] [address]  [place], [place]. [address] Telephone [phone] Telefax [phone]  Future Appointments  Date Time Provider Department  03/19/2023 11:00 AM Lucky Cowboy, MD [facility]    History of Present Illness:        Current Outpatient Medications on File Prior to Visit  Medication Sig   amitriptyline (ELAVIL) 25 MG tablet Take 1 tablet (25 mg total) by mouth at bedtime.   buPROPion (WELLBUTRIN XL) 300 MG 24 hr tablet TAKE 1 TABLET BY MOUTH DAILY FOR MOOD OR FOCUS OR CONCENTRATION   Fluticasone-Umeclidin-Vilant (TRELEGY ELLIPTA) 200-62.5-25 MCG/ACT AEPB Use  1 Inhalation  Daily  for Asthma   Multiple Vitamins-Minerals (MULTIVITAMIN WITH MINERALS) tablet Take 1 tablet by mouth daily.   Omega-3 Fatty Acids (FISH OIL PO) Take 1 capsule by mouth daily.   PROAIR HFA 108 (90 Base) MCG/ACT inhaler Use  2 inhalations 15 minutes apart every 4 hours as needed to rescue Asthma   Thyroid 48.75 MG TABS Take 1 tablet by mouth 2 (two) times daily.   No current facility-administered medications on file prior to visit.    Allergies  Allergen Reactions   Penicillins     REACTION: hives     Problem list She has ANEMIA-IRON DEFICIENCY; Anxiety state; Allergic rhinitis; Asthma; Hemorrhoids; Cystitis; Vitamin D deficiency; Hypothyroidism; and Ulnar neuritis, left on their problem list.   Observations/Objective:  There were no vitals taken for this visit.  HEENT - WNL. Neck - supple.  Chest - Clear equal BS. Cor - Nl HS. RRR w/o sig MGR. PP 1(+). No edema. MS- FROM w/o deformities.  Gait Nl. Neuro -  Nl w/o focal abnormalities.   Assessment and Plan:      Follow Up Instructions:        I discussed the assessment and treatment plan with the patient. The patient was provided an opportunity to ask  questions and all were answered. The patient agreed with the plan and demonstrated an understanding of the instructions.       The patient was advised to call back or seek an in-person evaluation if the symptoms worsen or if the condition fails to improve as anticipated.    Marinus Maw, MD

## 2022-12-04 ENCOUNTER — Other Ambulatory Visit: Payer: Self-pay | Admitting: Internal Medicine

## 2022-12-04 ENCOUNTER — Ambulatory Visit (HOSPITAL_BASED_OUTPATIENT_CLINIC_OR_DEPARTMENT_OTHER)
Admission: RE | Admit: 2022-12-04 | Discharge: 2022-12-04 | Disposition: A | Discharge status: Home / Self Care | Payer: BC Managed Care – PPO | Source: Ambulatory Visit | Attending: Internal Medicine | Admitting: Internal Medicine

## 2022-12-04 DIAGNOSIS — R1011 Right upper quadrant pain: Secondary | ICD-10-CM | POA: Diagnosis not present

## 2022-12-04 DIAGNOSIS — R109 Unspecified abdominal pain: Secondary | ICD-10-CM | POA: Diagnosis not present

## 2022-12-04 DIAGNOSIS — R1031 Right lower quadrant pain: Secondary | ICD-10-CM | POA: Insufficient documentation

## 2022-12-04 DIAGNOSIS — K8050 Calculus of bile duct without cholangitis or cholecystitis without obstruction: Secondary | ICD-10-CM

## 2022-12-04 DIAGNOSIS — K8020 Calculus of gallbladder without cholecystitis without obstruction: Secondary | ICD-10-CM | POA: Diagnosis not present

## 2022-12-04 NOTE — Progress Notes (Signed)
<>*<>*<>*<>*<>*<>*<>*<>*<>*<>*<>*<>*<>*<>*<>*<>*<>*<>*<>*<>*<>*<>*<>*<>*<> <>*<>*<>*<>*<>*<>*<>*<>*<>*<>*<>*<>*<>*<>*<>*<>*<>*<>*<>*<>*<>*<>*<>*<>*<>  -   WBC - sl elevated  <>*<>*<>*<>*<>*<>*<>*<>*<>*<>*<>*<>*<>*<>*<>*<>*<>*<>*<>*<>*<>*<>*<>*<>*<> <>*<>*<>*<>*<>*<>*<>*<>*<>*<>*<>*<>*<>*<>*<>*<>*<>*<>*<>*<>*<>*<>*<>*<>*<>  -  Initial U/A  looks unlikely to be (+) for UTI with  Negative Nitrates, only a few WBC  & Squamous cells suggesting not a "clean catch" specimen  Fri nite 12/04/2022 - at 8:00 pm & culture still pending  <>*<>*<>*<>*<>*<>*<>*<>*<>*<>*<>*<>*<>*<>*<>*<>*<>*<>*<>*<>*<>*<>*<>*<>*<> <>*<>*<>*<>*<>*<>*<>*<>*<>*<>*<>*<>*<>*<>*<>*<>*<>*<>*<>*<>*<>*<>*<>*<>*<>  -   Gallbladder U/S showed multiple gall stones & consistent with exam for source of symptoms with RUQ pains.   - So recommend  Surgical consultation &   likely they will recommend a "HIDA" scan to evaluate Gallbladder,  So I also ordered the Hida for expedience   <>*<>*<>*<>*<>*<>*<>*<>*<>*<>*<>*<>*<>*<>*<>*<>*<>*<>*<>*<>*<>*<>*<>*<>*<> <>*<>*<>*<>*<>*<>*<>*<>*<>*<>*<>*<>*<>*<>*<>*<>*<>*<>*<>*<>*<>*<>*<>*<>*<>

## 2022-12-05 NOTE — Progress Notes (Signed)
<>*<>*<>*<>*<>*<>*<>*<>*<>*<>*<>*<>*<>*<>*<>*<>*<>*<>*<>*<>*<>*<>*<>*<>*<> <>*<>*<>*<>*<>*<>*<>*<>*<>*<>*<>*<>*<>*<>*<>*<>*<>*<>*<>*<>*<>*<>*<>*<>*<>  -     Gallbladder U/S showed multiple gall stones & consistent with exam for source of symptoms with RUQ pains.  - So recommend  Surgical consultation &  likely they will recommend a "HIDA" scan to evaluate Gallbladder,  So I also ordered the [facility] for expedience  <>*<>*<>*<>*<>*<>*<>*<>*<>*<>*<>*<>*<>*<>*<>*<>*<>*<>*<>*<>*<>*<>*<>*<>*<> <>*<>*<>*<>*<>*<>*<>*<>*<>*<>*<>*<>*<>*<>*<>*<>*<>*<>*<>*<>*<>*<>*<>*<>*<>

## 2022-12-05 NOTE — Progress Notes (Signed)
Sat nite  9:15 pm ,   U/C  Still pending

## 2022-12-06 ENCOUNTER — Encounter: Payer: Self-pay | Admitting: Internal Medicine

## 2022-12-06 ENCOUNTER — Other Ambulatory Visit: Payer: Self-pay | Admitting: Internal Medicine

## 2022-12-06 DIAGNOSIS — B9620 Unspecified Escherichia coli [E. coli] as the cause of diseases classified elsewhere: Secondary | ICD-10-CM

## 2022-12-06 DIAGNOSIS — F988 Other specified behavioral and emotional disorders with onset usually occurring in childhood and adolescence: Secondary | ICD-10-CM | POA: Insufficient documentation

## 2022-12-06 LAB — CBC WITH DIFFERENTIAL/PLATELET
Absolute Lymphocytes: 915 {cells}/uL (ref 850–3900)
Absolute Monocytes: 988 {cells}/uL — ABNORMAL HIGH (ref 200–950)
Basophils Absolute: 24 {cells}/uL (ref 0–200)
Basophils Relative: 0.2 %
Eosinophils Absolute: 49 {cells}/uL (ref 15–500)
Eosinophils Relative: 0.4 %
HCT: 38.3 % (ref 35.0–45.0)
Hemoglobin: 12.2 g/dL (ref 11.7–15.5)
MCH: 25.3 pg — ABNORMAL LOW (ref 27.0–33.0)
MCHC: 31.9 g/dL — ABNORMAL LOW (ref 32.0–36.0)
MCV: 79.5 fL — ABNORMAL LOW (ref 80.0–100.0)
MPV: 11.4 fL (ref 7.5–12.5)
Monocytes Relative: 8.1 %
Neutro Abs: 10224 {cells}/uL — ABNORMAL HIGH (ref 1500–7800)
Neutrophils Relative %: 83.8 %
Platelets: 355 10*3/uL (ref 140–400)
RBC: 4.82 10*6/uL (ref 3.80–5.10)
RDW: 12.5 % (ref 11.0–15.0)
Total Lymphocyte: 7.5 %
WBC: 12.2 10*3/uL — ABNORMAL HIGH (ref 3.8–10.8)

## 2022-12-06 LAB — COMPLETE METABOLIC PANEL WITH GFR
AG Ratio: 1.6 (calc) (ref 1.0–2.5)
ALT: 12 U/L (ref 6–29)
AST: 13 U/L (ref 10–35)
Albumin: 4.5 g/dL (ref 3.6–5.1)
Alkaline phosphatase (APISO): 45 U/L (ref 31–125)
BUN: 15 mg/dL (ref 7–25)
CO2: 28 mmol/L (ref 20–32)
Calcium: 9.7 mg/dL (ref 8.6–10.2)
Chloride: 99 mmol/L (ref 98–110)
Creat: 0.8 mg/dL (ref 0.50–0.99)
Globulin: 2.8 g/dL (ref 1.9–3.7)
Glucose, Bld: 95 mg/dL (ref 65–99)
Potassium: 4.4 mmol/L (ref 3.5–5.3)
Sodium: 136 mmol/L (ref 135–146)
Total Bilirubin: 0.4 mg/dL (ref 0.2–1.2)
Total Protein: 7.3 g/dL (ref 6.1–8.1)
eGFR: 93 mL/min/{1.73_m2} (ref >=60)

## 2022-12-06 LAB — URINALYSIS, ROUTINE W REFLEX MICROSCOPIC
Bilirubin Urine: NEGATIVE
Glucose, UA: NEGATIVE
Hgb urine dipstick: NEGATIVE
Hyaline Cast: NONE SEEN /[LPF]
Ketones, ur: NEGATIVE
Nitrite: NEGATIVE
Protein, ur: NEGATIVE
RBC / HPF: NONE SEEN /[HPF] (ref 0–2)
Specific Gravity, Urine: 1.004 (ref 1.001–1.035)
pH: 6 (ref 5.0–8.0)

## 2022-12-06 LAB — URINE CULTURE
MICRO NUMBER:: 15763602
SPECIMEN QUALITY:: ADEQUATE

## 2022-12-06 LAB — AMYLASE: Amylase: 34 U/L (ref 21–101)

## 2022-12-06 NOTE — Progress Notes (Signed)
[][][][][][][][][][][][][][][][][][][][][][][][][][][][][][][][][][][][][][][][][]][][][][][][][][][][][][][][][][][][][][][][][[][][][][] [][][][][][][][][][][][][][][][][][][][][][][][][][][][][][][][][][][][][][][][][]][][][][][][][][][][][][][][][][][][][][][][][[][][][][]  -   U/C did return (+) positive for E.coli UTI which accounts for about 75-80% of all UTIs.  - Pleas complete the Macrobid & need to schedule a Nurse visit about 3 weeks after finish the Antibiotic to recheck your U/A & C/S to assure the infection has cleared  [] [] [] [] [] [] [] [] [] [] [] [] [] [] [] [] [] [] [] [] [] [] [] [] [] [] [] [] [] [] [] [] [] [] [] [] [] [] [] [] [] ][] [] [] [] [] [] [] [] [] [] [] [] [] [] [] [] [] [] [] [] [] [] [[] [] [] [] []  [] [] [] [] [] [] [] [] [] [] [] [] [] [] [] [] [] [] [] [] [] [] [] [] [] [] [] [] [] [] [] [] [] [] [] [] [] [] [] [] [] ][] [] [] [] [] [] [] [] [] [] [] [] [] [] [] [] [] [] [] [] [] [] [[] [] [] [] []

## 2022-12-06 NOTE — Patient Instructions (Addendum)
Cholelithiasis  Cholelithiasis is a disease in which gallstones form in the gallbladder. The gallbladder is an organ that stores bile. Bile is a fluid that helps to digest fats. Gallstones begin as small crystals and can slowly grow into stones. They may cause no symptoms until they block the gallbladder duct, or cystic duct, when the gallbladder tightens, or contracts, after food is eaten. This can cause pain and is known as a gallbladder attack, or biliary colic. There are two main types of gallstones: Cholesterol stones. These are the most common type of gallstone. These stones are made of hardened cholesterol and are usually yellow-green in color. Pigment stones. These are dark in color and are made of a red-yellow substance, called bilirubin,that forms when hemoglobin from red blood cells breaks down. What are the causes? This condition may be caused by too little or too much of the substances that are in bile. This can happen if the bile: Has too much bilirubin. This can happen in certain blood diseases, such as sickle cell anemia. Has too much cholesterol. Does not have enough bile salts. These salts help the body absorb and digest fats. It can also happen if the gallbladder is not emptying completely. This is common during pregnancy. What increases the risk? The following factors may make you more likely to develop this condition: Being older than 45 years of age. Eating a diet that is heavy in fried foods, fat, and refined carbohydrates, such as white bread and white rice. Being female. Having multiple pregnancies. Using medicines that contain female hormones (estrogen) for a long time. Having certain medical problems, such as: Diabetes mellitus. Obesity. Cystic fibrosis. Crohn's disease. Cirrhosis or other long-term (chronic) liver disease. Certain blood diseases, such as sickle cell anemia or leukemia. Having a family history of gallstones. Losing weight quickly. What are the  signs or symptoms? In many cases, having gallstones causes no symptoms. These are called silent gallstones. If a gallstone blocks your bile duct, it can cause a gallbladder attack. The main symptom of a gallbladder attack is sudden pain in the upper right part of the abdomen. The pain: Usually comes at night or after eating. Can last for one hour or more. Can spread to your right shoulder, back, or chest. Can feel like indigestion. This is discomfort, burning, or fullness in your upper abdomen. If the bile duct is blocked for more than a few hours, it can cause an infection or inflammation of your gallbladder (cholecystitis), liver, or pancreas. This can cause: Nausea or vomiting. Bloating. Pain in your abdomen that lasts for 5 hours or longer. Tenderness in your upper abdomen, often in the upper right section and under your rib cage. Fever or chills. Skin or the white parts of your eyes turning yellow (jaundice). This usually happens when a stone has blocked bile from passing through the bile duct. Dark pee (urine) or light-colored poop (stools). How is this diagnosed? This condition may be diagnosed based on: A physical exam. Your medical history. Ultrasound. CT scan. MRI. You may also have other tests, including: Blood tests to check for infection or inflammation. The HIDA scan to see the gallbladder and the bile ducts. An endoscope to check for blockage in the bile ducts. How is this treated? Treatment depends on the severity of your symptoms. Silent gallstones do not need treatment. You may need treatment if a blockage causes a gallbladder attack or other symptoms. Treatment may include: If symptoms are mild, you may care for yourself at home.  For mild symptoms: Stop eating and drinking for 12-24 hours. You may drink water and clear liquids. This helps to "cool down" your gallbladder. After 1 or 2 days, eat a diet of simple or clear foods, such as broths and crackers. Take medicines  for pain or nausea. Take antibiotics if you have an infection. If symptoms are severe, you may: Stay in the hospital for pain control or to treat severe infection. Have surgery to remove the gallbladder (cholecystectomy). This is the most common treatment if all other treatments have not worked. Take medicines to break up gallstones. Medicines may be used for up to 6-12 months. Have an procedure to capture and remove gallstones. Follow these instructions at home: Medicines Take over-the-counter and prescription medicines only as told by your health care provider. If you were prescribed antibiotics, take them as told by your provider. Do not stop using the antibiotic even if you start to feel better. Ask your provider if the medicine prescribed to you requires you to avoid driving or using machinery. Eating and drinking Drink enough fluid to keep your pee pale yellow. This is important during a gallbladder attack. Water and clear liquids are preferred. Follow a healthy diet. This includes: Reducing fatty foods, such as fried food and foods high in cholesterol. Reducing refined carbohydrates, such as white bread and white rice. Eating more fiber. Aim for foods such as almonds, fruit, and beans. General instructions Do not use any products that contain nicotine or tobacco. These products include cigarettes, chewing tobacco, and vaping devices, such as e-cigarettes. If you need help quitting, ask your provider. Maintain a healthy weight. Keep all follow-up visits. These may include seeing a specialist or a Careers adviser. Where to find more information [employer] [facility] [employer] [facility] [employer]: [email or web address] Contact a health care provider if: You think you have had a gallbladder attack. You have been diagnosed with silent gallstones and you develop indigestion or pain in your abdomen. You have pain from a gallbladder attack that lasts for more than 2 hours. You begin to have  attacks more often. You have nausea. You have dark pee or light-colored poop. Get help right away if: You have pain in your abdomen that lasts for more than 5 hours or is getting worse. You have a fever or chills. You have vomiting that does not go away. You develop jaundice. Minimally Invasive Cholecystectomy Minimally invasive cholecystectomy is surgery to remove the gallbladder. The gallbladder is a pear-shaped organ that lies beneath the liver on the right side of the body. The gallbladder stores bile, which is a fluid that helps the body digest fats. Cholecystectomy is often done to treat inflammation (irritation and swelling) of the gallbladder (cholecystitis). This condition is usually caused by a buildup of gallstones (cholelithiasis) in the gallbladder or when the fluid in the gall bladder becomes stagnant because gallstones get stuck in the ducts (tubes) and block the flow of bile. This can result in inflammation and pain. In severe cases, emergency surgery may be required. This procedure is done through small incisions in the abdomen, instead of one large incision. It is also called laparoscopic surgery. A thin scope with a camera (laparoscope) is inserted through one incision. Then surgical instruments are inserted through the other incisions. In some cases, a minimally invasive surgery may need to be changed to a surgery that is done through a larger incision. This is called open surgery. Tell a health care provider about: Any allergies you have. All medicines  you are taking, including vitamins, herbs, eye drops, creams, and over-the-counter medicines. Any problems you or family members have had with anesthetic medicines. Any bleeding problems you have. Any surgeries you have had. Any medical conditions you have. Whether you are pregnant or may be pregnant. What are the risks? Generally, this is a safe procedure. However, problems may occur,  including: Infection. Bleeding. Allergic reactions to medicines. Damage to nearby structures or organs. A gallstone remaining in the common bile duct. The common bile duct carries bile from the gallbladder to the small intestine. A bile leak from the liver or cystic duct after your gallbladder is removed. What happens before the procedure? When to stop eating and drinking Follow instructions from your health care provider about what you may eat and drink before your procedure. These may include: 8 hours before the procedure Stop eating most foods. Do not eat meat, fried foods, or fatty foods. Eat only light foods, such as toast or crackers. All liquids are okay except energy drinks and alcohol. 6 hours before the procedure Stop eating. Drink only clear liquids, such as water, clear fruit juice, black coffee, plain tea, and sports drinks. Do not drink energy drinks or alcohol. 2 hours before the procedure Stop drinking all liquids. You may be allowed to take medicines with small sips of water. If you do not follow your health care provider's instructions, your procedure may be delayed or canceled. Medicines Ask your health care provider about: Changing or stopping your regular medicines. This is especially important if you are taking diabetes medicines or blood thinners. Taking medicines such as aspirin and ibuprofen. These medicines can thin your blood. Do not take these medicines unless your health care provider tells you to take them. Taking over-the-counter medicines, vitamins, herbs, and supplements. General instructions If you will be going home right after the procedure, plan to have a responsible adult: Take you home from the hospital or clinic. You will not be allowed to drive. Care for you for the time you are told. Do not use any products that contain nicotine or tobacco for at least 4 weeks before the procedure. These products include cigarettes, chewing tobacco, and vaping  devices, such as e-cigarettes. If you need help quitting, ask your health care provider. Ask your health care provider: How your surgery site will be marked. What steps will be taken to help prevent infection. These may include: Removing hair at the surgery site. Washing skin with a germ-killing soap. Taking antibiotic medicine. What happens during the procedure?  An IV will be inserted into one of your veins. You will be given one or both of the following: A medicine to help you relax (sedative). A medicine to make you fall asleep (general anesthetic). Your surgeon will make several small incisions in your abdomen. The laparoscope will be inserted through one of the small incisions. The camera on the laparoscope will send images to a monitor in the operating room. This lets your surgeon see inside your abdomen. A gas will be pumped into your abdomen. This will expand your abdomen to give the surgeon more room to perform the surgery. Other tools that are needed for the procedure will be inserted through the other incisions. The gallbladder will be removed through one of the incisions. Your common bile duct may be examined. If stones are found in the common bile duct, they may be removed. After your gallbladder has been removed, the incisions will be closed with stitches (sutures), staples, or skin glue.  Your incisions will be covered with a bandage (dressing). The procedure may vary among health care providers and hospitals. What happens after the procedure? Your blood pressure, heart rate, breathing rate, and blood oxygen level will be monitored until you leave the hospital or clinic. You will be given medicines as needed to control your pain. You may have a drain placed in the incision. The drain will be removed a day or two after the procedure. Summary Minimally invasive cholecystectomy, also called laparoscopic cholecystectomy, is surgery to remove the gallbladder using small  incisions. Tell your health care provider about all the medical conditions you have and all the medicines you are taking for those conditions. Before the procedure, follow instructions about when to stop eating and drinking and changing or stopping medicines. Plan to have a responsible adult care for you for the time you are told after you leave the hospital or clinic.

## 2023-03-01 DIAGNOSIS — R87810 Cervical high risk human papillomavirus (HPV) DNA test positive: Secondary | ICD-10-CM | POA: Diagnosis not present

## 2023-03-01 DIAGNOSIS — Z1331 Encounter for screening for depression: Secondary | ICD-10-CM | POA: Diagnosis not present

## 2023-03-01 DIAGNOSIS — B3731 Acute candidiasis of vulva and vagina: Secondary | ICD-10-CM | POA: Diagnosis not present

## 2023-03-01 DIAGNOSIS — Z01419 Encounter for gynecological examination (general) (routine) without abnormal findings: Secondary | ICD-10-CM | POA: Diagnosis not present

## 2023-03-01 DIAGNOSIS — Z1231 Encounter for screening mammogram for malignant neoplasm of breast: Secondary | ICD-10-CM | POA: Diagnosis not present

## 2023-03-01 DIAGNOSIS — Z01411 Encounter for gynecological examination (general) (routine) with abnormal findings: Secondary | ICD-10-CM | POA: Diagnosis not present

## 2023-03-17 ENCOUNTER — Encounter: Payer: BC Managed Care – PPO | Admitting: Internal Medicine

## 2023-03-19 ENCOUNTER — Encounter: Payer: BC Managed Care – PPO | Admitting: Internal Medicine

## 2023-07-23 DIAGNOSIS — E063 Autoimmune thyroiditis: Secondary | ICD-10-CM | POA: Diagnosis not present

## 2023-07-23 DIAGNOSIS — R5381 Other malaise: Secondary | ICD-10-CM | POA: Diagnosis not present

## 2023-07-23 DIAGNOSIS — E559 Vitamin D deficiency, unspecified: Secondary | ICD-10-CM | POA: Diagnosis not present

## 2023-07-23 DIAGNOSIS — F909 Attention-deficit hyperactivity disorder, unspecified type: Secondary | ICD-10-CM | POA: Diagnosis not present

## 2023-07-23 DIAGNOSIS — E039 Hypothyroidism, unspecified: Secondary | ICD-10-CM | POA: Diagnosis not present

## 2023-07-23 DIAGNOSIS — Z1329 Encounter for screening for other suspected endocrine disorder: Secondary | ICD-10-CM | POA: Diagnosis not present

## 2023-08-25 DIAGNOSIS — L718 Other rosacea: Secondary | ICD-10-CM | POA: Diagnosis not present

## 2023-09-03 ENCOUNTER — Other Ambulatory Visit: Payer: Self-pay | Admitting: Nurse Practitioner

## 2023-09-03 DIAGNOSIS — F988 Other specified behavioral and emotional disorders with onset usually occurring in childhood and adolescence: Secondary | ICD-10-CM
